# Patient Record
Sex: Male | Born: 1960
Health system: Southern US, Community
[De-identification: ages and names within clinical notes are randomized; demographics above are authoritative.]

## PROBLEM LIST (undated history)

## (undated) DIAGNOSIS — I10 Essential (primary) hypertension: Secondary | ICD-10-CM

## (undated) DIAGNOSIS — E119 Type 2 diabetes mellitus without complications: Secondary | ICD-10-CM

## (undated) HISTORY — DX: Type 2 diabetes mellitus without complications: E11.9

## (undated) HISTORY — DX: Essential (primary) hypertension: I10

---

## 2017-03-26 ENCOUNTER — Telehealth: Payer: Self-pay | Admitting: Physician Assistant

## 2017-03-26 NOTE — Telephone Encounter (Signed)
Called pt to remind them of their appt tomorrow. Advised them of the time, building number, early arrival and late policy. °

## 2017-03-27 ENCOUNTER — Ambulatory Visit: Payer: BLUE CROSS/BLUE SHIELD | Admitting: Physician Assistant

## 2017-03-27 ENCOUNTER — Other Ambulatory Visit: Payer: Self-pay

## 2017-03-27 ENCOUNTER — Encounter: Payer: Self-pay | Admitting: Physician Assistant

## 2017-03-27 VITALS — BP 159/93 | HR 70 | Temp 98.3°F | Resp 18 | Ht 71.65 in | Wt 160.4 lb

## 2017-03-27 DIAGNOSIS — I10 Essential (primary) hypertension: Secondary | ICD-10-CM | POA: Diagnosis not present

## 2017-03-27 DIAGNOSIS — R9431 Abnormal electrocardiogram [ECG] [EKG]: Secondary | ICD-10-CM | POA: Diagnosis not present

## 2017-03-27 DIAGNOSIS — Z23 Encounter for immunization: Secondary | ICD-10-CM | POA: Diagnosis not present

## 2017-03-27 DIAGNOSIS — E1165 Type 2 diabetes mellitus with hyperglycemia: Secondary | ICD-10-CM | POA: Diagnosis not present

## 2017-03-27 LAB — URINALYSIS, DIPSTICK ONLY
Bilirubin, UA: NEGATIVE
GLUCOSE, UA: NEGATIVE
Ketones, UA: NEGATIVE
Leukocytes, UA: NEGATIVE
Nitrite, UA: NEGATIVE
PH UA: 5.5 (ref 5.0–7.5)
Specific Gravity, UA: 1.022 (ref 1.005–1.030)
UUROB: 0.2 mg/dL (ref 0.2–1.0)

## 2017-03-27 LAB — POCT GLYCOSYLATED HEMOGLOBIN (HGB A1C): Hemoglobin A1C: 8.6

## 2017-03-27 MED ORDER — ATORVASTATIN CALCIUM 40 MG PO TABS: 40.0000 mg | ORAL_TABLET | Freq: Every day | ORAL | 0 refills | Status: DC

## 2017-03-27 MED ORDER — AMLODIPINE BESYLATE 5 MG PO TABS: 5.0000 mg | ORAL_TABLET | Freq: Every day | ORAL | 0 refills | Status: DC

## 2017-03-27 MED ORDER — METFORMIN HCL 1000 MG PO TABS: 1000.0000 mg | ORAL_TABLET | Freq: Every day | ORAL | 0 refills | Status: DC

## 2017-03-27 NOTE — Progress Notes (Signed)
03/27/2017 9:22 AM   DOB: 03-07-1960 / MRN: [id number]  SUBJECTIVE:  Tim Stuart is a 57 y.o. male former smoker presenting to establish care.  He is a hypertensive as well as a diabetic.  He has emigrated within the last 6 months from [place].  He has some medications with him here today however the text is in [place].  He speaks fairly good [place].  He denies a history of MI, stroke.  He is willing to go see a cardiologist given his abnormal EKG today.  He has No Known Allergies.   He  has a past medical history of Diabetes mellitus without complication ([place]) and Hypertension.    He  reports that he quit smoking about 3 years ago. His smoking use included cigarettes. He quit after 10.00 years of use. he has never used smokeless tobacco. He reports that he does not drink alcohol or use drugs. He  has no sexual activity history on file. The patient  has no past surgical history on file.  His family history includes Diabetes in his mother.  Review of Systems  Constitutional: Negative for chills, diaphoresis and fever.  Eyes: Negative.   Respiratory: Negative for cough, hemoptysis, sputum production, shortness of breath and wheezing.   Cardiovascular: Negative for chest pain, orthopnea and leg swelling.  Gastrointestinal: Negative for abdominal pain, blood in stool, constipation, diarrhea, heartburn, melena, nausea and vomiting.  Genitourinary: Negative for dysuria, flank pain, frequency, hematuria and urgency.  Skin: Negative for rash.  Neurological: Negative for dizziness, sensory change, speech change, focal weakness and headaches.    The problem list and medications were reviewed and updated by myself where necessary and exist elsewhere in the encounter.   OBJECTIVE:  BP (!) 159/93 (BP Location: Right Arm, Patient Position: Sitting, Cuff Size: Normal)   Pulse 70   Temp 98.3 F (36.8 C) (Oral)   Resp 18   Ht 5' 11.65" (1.82 m)   Wt 160 lb 6.4 oz (72.8 kg)   SpO2 99%    BMI 21.96 kg/m   Physical Exam  Constitutional: He appears well-developed. He is active and cooperative.  Non-toxic appearance.  HENT:  Right Ear: Hearing, tympanic membrane, external ear and ear canal normal.  Left Ear: Hearing, tympanic membrane, external ear and ear canal normal.  Nose: Nose normal. Right sinus exhibits no maxillary sinus tenderness and no frontal sinus tenderness. Left sinus exhibits no maxillary sinus tenderness and no frontal sinus tenderness.  Mouth/Throat: Uvula is midline, oropharynx is clear and moist and mucous membranes are normal. No oropharyngeal exudate, posterior oropharyngeal edema or tonsillar abscesses.  Eyes: Conjunctivae are normal. Pupils are equal, round, and reactive to light.  Cardiovascular: Normal rate, regular rhythm, S1 normal, S2 normal, normal heart sounds, intact distal pulses and normal pulses. Exam reveals no gallop and no friction rub.  No murmur heard. Pulmonary/Chest: Effort normal. No stridor. No tachypnea. No respiratory distress. He has no wheezes. He has no rales.  Abdominal: He exhibits no distension.  Musculoskeletal: He exhibits no edema.  Lymphadenopathy:       Head (right side): No submandibular and no tonsillar adenopathy present.       Head (left side): No submandibular and no tonsillar adenopathy present.    He has no cervical adenopathy.  Neurological: He is alert.  Skin: Skin is warm and dry. He is not diaphoretic. No pallor.  Vitals reviewed.     Results for orders placed or performed in visit on 03/27/17 (from the  past 72 hour(s))  POCT glycosylated hemoglobin (Hb A1C)     Status: Abnormal   Collection Time: 03/27/17  9:18 AM  Result Value Ref Range   Hemoglobin A1C 8.6     No results found.  ASSESSMENT AND PLAN:  [name] was seen today for establish care and diabetes.  Diagnoses and all orders for this visit:  Uncontrolled type 2 diabetes mellitus with hyperglycemia ([place]) -     POCT glycosylated  hemoglobin (Hb A1C) -     CBC with Differential -     CMP14+EGFR -     Urinalysis, dipstick only -     EKG 12-Lead -     Ambulatory referral to Ophthalmology -     metFORMIN (GLUCOPHAGE) 1000 MG tablet; Take 1 tablet (1,000 mg total) by mouth daily with breakfast.  Uncontrolled hypertension -     amLODipine (NORVASC) 5 MG tablet; Take 1 tablet (5 mg total) by mouth daily.  Needs flu shot -     Flu Vaccine QUAD 36+ mos IM  Need for prophylactic vaccination against Streptococcus pneumoniae (pneumococcus) -     Pneumococcal polysaccharide vaccine 23-valent greater than or equal to 2yo subcutaneous/IM  Nonspecific abnormal electrocardiogram (ECG) (EKG) -     Ambulatory referral to Cardiology  Other orders -     atorvastatin (LIPITOR) 40 MG tablet; Take 1 tablet (40 mg total) by mouth daily.    The patient is advised to call or return to clinic if he does not see an improvement in symptoms, or to seek the care of the closest emergency department if he worsens with the above plan.   Philis Fendt, MHS, PA-C [facility] 03/27/2017 9:22 AM

## 2017-03-27 NOTE — Patient Instructions (Addendum)
I am starting you on 3 medications.  One is a cholesterol medicine, the other is a diabetic medication and the other is an antihypertensive medication.  We will likely change these medications at your next visit in 10 days after your labs have resulted.    IF you received an x-ray today, you will receive an [facility]. Please contact [facility] at [phone] with questions or concerns regarding your invoice.   IF you received labwork today, you will receive an invoice from [facility]. Please contact [facility] at [phone] with questions or concerns regarding your invoice.   Our billing staff will not be able to assist you with questions regarding bills from these companies.  You will be contacted with the lab results as soon as they are available. The fastest way to get your results is to activate your My Chart account. Instructions are located on the last page of this paperwork. If you have not heard from us regarding the results in 2 weeks, please contact this office.

## 2017-03-28 LAB — CBC WITH DIFFERENTIAL/PLATELET
BASOS: 0 %
Basophils Absolute: 0 10*3/uL (ref 0.0–0.2)
EOS (ABSOLUTE): 0.4 10*3/uL (ref 0.0–0.4)
Eos: 4 %
HEMATOCRIT: 48.7 % (ref 37.5–51.0)
HEMOGLOBIN: 16.9 g/dL (ref 13.0–17.7)
IMMATURE GRANS (ABS): 0 10*3/uL (ref 0.0–0.1)
IMMATURE GRANULOCYTES: 0 %
LYMPHS: 34 %
Lymphocytes Absolute: 2.8 10*3/uL (ref 0.7–3.1)
MCH: 29.9 pg (ref 26.6–33.0)
MCHC: 34.7 g/dL (ref 31.5–35.7)
MCV: 86 fL (ref 79–97)
MONOS ABS: 0.6 10*3/uL (ref 0.1–0.9)
Monocytes: 8 %
NEUTROS PCT: 54 %
Neutrophils Absolute: 4.4 10*3/uL (ref 1.4–7.0)
Platelets: 231 10*3/uL (ref 150–379)
RBC: 5.65 x10E6/uL (ref 4.14–5.80)
RDW: 14.1 % (ref 12.3–15.4)
WBC: 8.2 10*3/uL (ref 3.4–10.8)

## 2017-03-28 LAB — CMP14+EGFR
A/G RATIO: 1.5 (ref 1.2–2.2)
ALT: 18 IU/L (ref 0–44)
AST: 18 IU/L (ref 0–40)
Albumin: 4.4 g/dL (ref 3.5–5.5)
Alkaline Phosphatase: 98 IU/L (ref 39–117)
BUN/Creatinine Ratio: 12 (ref 9–20)
BUN: 9 mg/dL (ref 6–24)
Bilirubin Total: 0.6 mg/dL (ref 0.0–1.2)
CALCIUM: 9.8 mg/dL (ref 8.7–10.2)
CO2: 24 mmol/L (ref 20–29)
CREATININE: 0.78 mg/dL (ref 0.76–1.27)
Chloride: 98 mmol/L (ref 96–106)
GFR calc Af Amer: 117 mL/min/{1.73_m2} (ref >59)
GFR, EST NON AFRICAN AMERICAN: 101 mL/min/{1.73_m2} (ref >59)
GLOBULIN, TOTAL: 2.9 g/dL (ref 1.5–4.5)
Glucose: 173 mg/dL — ABNORMAL HIGH (ref 65–99)
POTASSIUM: 4.3 mmol/L (ref 3.5–5.2)
SODIUM: 139 mmol/L (ref 134–144)
TOTAL PROTEIN: 7.3 g/dL (ref 6.0–8.5)

## 2017-03-28 NOTE — Progress Notes (Signed)
FYI. No need to call.  Patient coming back in ten days for lab review. Tim BostonMichael Kiarah Eckstein, MS, PA-C 4:46 PM, 03/28/2017

## 2017-04-05 ENCOUNTER — Ambulatory Visit: Payer: BLUE CROSS/BLUE SHIELD | Admitting: Physician Assistant

## 2017-04-05 DIAGNOSIS — I10 Essential (primary) hypertension: Secondary | ICD-10-CM

## 2017-04-05 DIAGNOSIS — E1165 Type 2 diabetes mellitus with hyperglycemia: Secondary | ICD-10-CM | POA: Diagnosis not present

## 2017-04-05 MED ORDER — AMLODIPINE BESYLATE 5 MG PO TABS: 5.0000 mg | ORAL_TABLET | Freq: Every day | ORAL | 3 refills | Status: DC

## 2017-04-05 MED ORDER — METFORMIN HCL 1000 MG PO TABS: 1000.0000 mg | ORAL_TABLET | Freq: Two times a day (BID) | ORAL | 3 refills | Status: DC

## 2017-04-05 MED ORDER — ATORVASTATIN CALCIUM 40 MG PO TABS: 40.0000 mg | ORAL_TABLET | Freq: Every day | ORAL | 3 refills | Status: DC

## 2017-04-05 NOTE — Progress Notes (Signed)
04/05/2017 8:45 AM   DOB: Dec 01, 1960 / MRN: [id number]  SUBJECTIVE:  Tim Stuart is a 57 y.o. male presenting for lab review and blood pressure check.  Patient has been compliant with 40 of atorvastatin, 1000 metformin daily along with 5 Norvasc.  He feels well denies any complaints.  He has yet to hear about his cardiology and ophthalmology referrals.  He has No Known Allergies.   He  has a past medical history of Diabetes mellitus without complication ([facility]) and Hypertension.    He  reports that he quit smoking about 3 years ago. His smoking use included cigarettes. He quit after 10.00 years of use. He has never used smokeless tobacco. He reports that he does not drink alcohol or use drugs. He  has no sexual activity history on file. The patient  has no past surgical history on file.  His family history includes Diabetes in his mother.  Review of Systems  Constitutional: Negative for chills, diaphoresis and fever.  Eyes: Negative.   Respiratory: Negative for cough, hemoptysis, sputum production, shortness of breath and wheezing.   Cardiovascular: Negative for chest pain, orthopnea and leg swelling.  Gastrointestinal: Negative for abdominal pain, blood in stool, constipation, diarrhea, heartburn, melena, nausea and vomiting.  Genitourinary: Negative for flank pain.  Skin: Negative for rash.  Neurological: Negative for dizziness, sensory change, speech change, focal weakness and headaches.    The problem list and medications were reviewed and updated by myself where necessary and exist elsewhere in the encounter.   OBJECTIVE:  BP 132/74   Pulse 65   Temp 97.9 F (36.6 C) (Oral)   Resp 16   Ht 5\' 11"  (1.803 m)   Wt 160 lb (72.6 kg)   SpO2 98%   BMI 22.32 kg/m   Wt Readings from Last 3 Encounters:  04/05/17 160 lb (72.6 kg)  03/27/17 160 lb 6.4 oz (72.8 kg)   Temp Readings from Last 3 Encounters:  04/05/17 97.9 F (36.6 C) (Oral)  03/27/17 98.3 F (36.8 C) (Oral)     BP Readings from Last 3 Encounters:  04/05/17 132/74  03/27/17 (!) 159/93   Pulse Readings from Last 3 Encounters:  04/05/17 65  03/27/17 70     Lab Results  Component Value Date   WBC 8.2 03/27/2017   HGB 16.9 03/27/2017   HCT 48.7 03/27/2017   MCV 86 03/27/2017   PLT 231 03/27/2017    Lab Results  Component Value Date   CREATININE 0.78 03/27/2017   BUN 9 03/27/2017   NA 139 03/27/2017   K 4.3 03/27/2017   CL 98 03/27/2017   CO2 24 03/27/2017    Lab Results  Component Value Date   ALT 18 03/27/2017   AST 18 03/27/2017   ALKPHOS 98 03/27/2017   BILITOT 0.6 03/27/2017   Lab Results  Component Value Date   HGBA1C 8.6 03/27/2017    No results found for: CHOL, HDL, LDLCALC, LDLDIRECT, TRIG, CHOLHDL   Physical Exam  Constitutional: He appears well-developed. He is active and cooperative.  Non-toxic appearance.  Cardiovascular: Normal rate.  Pulmonary/Chest: Effort normal. No tachypnea.  Neurological: He is alert.  Skin: Skin is warm and dry. He is not diaphoretic. No pallor.  Vitals reviewed.   No results found for this or any previous visit (from the past 72 hour(s)).  No results found.  ASSESSMENT AND PLAN:  Tim Stuart was seen today for medication management.  Diagnoses and all orders for this visit:  Uncontrolled  type 2 diabetes mellitus with hyperglycemia ([facility]) -     metFORMIN (GLUCOPHAGE) 1000 MG tablet; Take 1 tablet (1,000 mg total) by mouth 2 (two) times daily with a meal. -     atorvastatin (LIPITOR) 40 MG tablet; Take 1 tablet (40 mg total) by mouth daily.  Well-controlled hypertension -     amLODipine (NORVASC) 5 MG tablet; Take 1 tablet (5 mg total) by mouth daily.    The patient is advised to call or return to clinic if he does not see an improvement in symptoms, or to seek the care of the closest emergency department if he worsens with the above plan.   Deliah Boston, MHS, PA-C [facility] at [facility] 04/05/2017 8:45 AM

## 2017-04-05 NOTE — Patient Instructions (Signed)
     IF you received an x-ray today, you will receive an invoice from [facility]. Please contact [facility] at [phone] with questions or concerns regarding your invoice.   IF you received labwork today, you will receive an invoice from [facility]. Please contact [facility] at [phone] with questions or concerns regarding your invoice.   Our billing staff will not be able to assist you with questions regarding bills from these companies.  You will be contacted with the lab results as soon as they are available. The fastest way to get your results is to activate your My Chart account. Instructions are located on the last page of this paperwork. If you have not heard from us regarding the results in 2 weeks, please contact this office.     

## 2017-04-19 LAB — HM DIABETES EYE EXAM

## 2017-04-25 ENCOUNTER — Encounter: Payer: Self-pay | Admitting: *Deleted

## 2017-05-16 ENCOUNTER — Encounter: Payer: Self-pay | Admitting: *Deleted

## 2017-07-08 ENCOUNTER — Encounter: Payer: Self-pay | Admitting: Physician Assistant

## 2017-07-08 ENCOUNTER — Ambulatory Visit: Payer: BLUE CROSS/BLUE SHIELD | Admitting: Physician Assistant

## 2017-07-08 ENCOUNTER — Other Ambulatory Visit: Payer: Self-pay

## 2017-07-08 VITALS — BP 135/76 | HR 85 | Temp 98.0°F | Resp 16 | Ht 71.0 in | Wt 163.2 lb

## 2017-07-08 DIAGNOSIS — L600 Ingrowing nail: Secondary | ICD-10-CM | POA: Diagnosis not present

## 2017-07-08 DIAGNOSIS — Z23 Encounter for immunization: Secondary | ICD-10-CM | POA: Diagnosis not present

## 2017-07-08 DIAGNOSIS — Z114 Encounter for screening for human immunodeficiency virus [HIV]: Secondary | ICD-10-CM | POA: Diagnosis not present

## 2017-07-08 DIAGNOSIS — Z1211 Encounter for screening for malignant neoplasm of colon: Secondary | ICD-10-CM

## 2017-07-08 DIAGNOSIS — E1165 Type 2 diabetes mellitus with hyperglycemia: Secondary | ICD-10-CM

## 2017-07-08 DIAGNOSIS — Z1159 Encounter for screening for other viral diseases: Secondary | ICD-10-CM

## 2017-07-08 LAB — POCT URINALYSIS DIP (MANUAL ENTRY)
BILIRUBIN UA: NEGATIVE
BILIRUBIN UA: NEGATIVE mg/dL
LEUKOCYTES UA: NEGATIVE
NITRITE UA: NEGATIVE
PH UA: 6 (ref 5.0–8.0)
Protein Ur, POC: 30 mg/dL — AB
Spec Grav, UA: 1.02 (ref 1.010–1.025)
Urobilinogen, UA: 0.2 E.U./dL

## 2017-07-08 LAB — POCT GLYCOSYLATED HEMOGLOBIN (HGB A1C): HEMOGLOBIN A1C: 10.4 % — AB (ref 4.0–5.6)

## 2017-07-08 MED ORDER — ZOSTER VAC RECOMB ADJUVANTED 50 MCG/0.5ML IM SUSR: 0.5000 mL | Freq: Once | INTRAMUSCULAR | 1 refills | Status: AC

## 2017-07-08 MED ORDER — ATORVASTATIN CALCIUM 40 MG PO TABS: 40.0000 mg | ORAL_TABLET | Freq: Every day | ORAL | 3 refills | Status: DC

## 2017-07-08 MED ORDER — METFORMIN HCL ER 500 MG PO TB24: ORAL_TABLET | ORAL | 1 refills | Status: DC

## 2017-07-08 NOTE — Patient Instructions (Addendum)
Diabetes Mellitus and Nutrition When you have diabetes (diabetes mellitus), it is very important to have healthy eating habits because your blood sugar (glucose) levels are greatly affected by what you eat and drink. Eating healthy foods in the appropriate amounts, at about the same times every day, can help you:  Control your blood glucose.  Lower your risk of heart disease.  Improve your blood pressure.  Reach or maintain a healthy weight.  Every person with diabetes is different, and each person has different needs for a meal plan. Your health care provider may recommend that you work with a diet and nutrition specialist (dietitian) to make a meal plan that is best for you. Your meal plan may vary depending on factors such as:  The calories you need.  The medicines you take.  Your weight.  Your blood glucose, blood pressure, and cholesterol levels.  Your activity level.  Other health conditions you have, such as heart or kidney disease.  How do carbohydrates affect me? Carbohydrates affect your blood glucose level more than any other type of food. Eating carbohydrates naturally increases the amount of glucose in your blood. Carbohydrate counting is a method for keeping track of how many carbohydrates you eat. Counting carbohydrates is important to keep your blood glucose at a healthy level, especially if you use insulin or take certain oral diabetes medicines. It is important to know how many carbohydrates you can safely have in each meal. This is different for every person. Your dietitian can help you calculate how many carbohydrates you should have at each meal and for snack. Foods that contain carbohydrates include:  Bread, cereal, rice, pasta, and crackers.  Potatoes and corn.  Peas, beans, and lentils.  Milk and yogurt.  Fruit and juice.  Desserts, such as cakes, cookies, ice cream, and candy.  How does alcohol affect me? Alcohol can cause a sudden decrease in blood  glucose (hypoglycemia), especially if you use insulin or take certain oral diabetes medicines. Hypoglycemia can be a life-threatening condition. Symptoms of hypoglycemia (sleepiness, dizziness, and confusion) are similar to symptoms of having too much alcohol. If your health care provider says that alcohol is safe for you, follow these guidelines:  Limit alcohol intake to no more than 1 drink per day for nonpregnant women and 2 drinks per day for men. One drink equals 12 oz of beer, 5 oz of wine, or 1 oz of hard liquor.  Do not drink on an empty stomach.  Keep yourself hydrated with water, diet soda, or unsweetened iced tea.  Keep in mind that regular soda, juice, and other mixers may contain a lot of sugar and must be counted as carbohydrates.  What are tips for following this plan? Reading food labels  Start by checking the serving size on the label. The amount of calories, carbohydrates, fats, and other nutrients listed on the label are based on one serving of the food. Many foods contain more than one serving per package.  Check the total grams (g) of carbohydrates in one serving. You can calculate the number of servings of carbohydrates in one serving by dividing the total carbohydrates by 15. For example, if a food has 30 g of total carbohydrates, it would be equal to 2 servings of carbohydrates.  Check the number of grams (g) of saturated and trans fats in one serving. Choose foods that have low or no amount of these fats.  Check the number of milligrams (mg) of sodium in one serving. Most people   should limit total sodium intake to less than 2,300 mg per day.  Always check the nutrition information of foods labeled as "low-fat" or "nonfat". These foods may be higher in added sugar or refined carbohydrates and should be avoided.  Talk to your dietitian to identify your daily goals for nutrients listed on the label. Shopping  Avoid buying canned, premade, or processed foods. These  foods tend to be high in fat, sodium, and added sugar.  Shop around the outside edge of the grocery store. This includes fresh fruits and vegetables, bulk grains, fresh meats, and fresh dairy. Cooking  Use low-heat cooking methods, such as baking, instead of high-heat cooking methods like deep frying.  Cook using healthy oils, such as olive, canola, or sunflower oil.  Avoid cooking with butter, cream, or high-fat meats. Meal planning  Eat meals and snacks regularly, preferably at the same times every day. Avoid going long periods of time without eating.  Eat foods high in fiber, such as fresh fruits, vegetables, beans, and whole grains. Talk to your dietitian about how many servings of carbohydrates you can eat at each meal.  Eat 4-6 ounces of lean protein each day, such as lean meat, chicken, fish, eggs, or tofu. 1 ounce is equal to 1 ounce of meat, chicken, or fish, 1 egg, or 1/4 cup of tofu.  Eat some foods each day that contain healthy fats, such as avocado, nuts, seeds, and fish. Lifestyle   Check your blood glucose regularly.  Exercise at least 30 minutes 5 or more days each week, or as told by your health care provider.  Take medicines as told by your health care provider.  Do not use any products that contain nicotine or tobacco, such as cigarettes and e-cigarettes. If you need help quitting, ask your health care provider.  Work with a counselor or diabetes educator to identify strategies to manage stress and any emotional and social challenges. What are some questions to ask my health care provider?  Do I need to meet with a diabetes educator?  Do I need to meet with a dietitian?  What number can I call if I have questions?  When are the best times to check my blood glucose? Where to find more information:  American Diabetes Association: diabetes.org/food-and-fitness/food  Academy of Nutrition and Dietetics:  www.eatright.org/resources/health/diseases-and-conditions/diabetes  National Institute of Diabetes and Digestive and Kidney Diseases (NIH): www.niddk.nih.gov/health-information/diabetes/overview/diet-eating-physical-activity Summary  A healthy meal plan will help you control your blood glucose and maintain a healthy lifestyle.  Working with a diet and nutrition specialist (dietitian) can help you make a meal plan that is best for you.  Keep in mind that carbohydrates and alcohol have immediate effects on your blood glucose levels. It is important to count carbohydrates and to use alcohol carefully. This information is not intended to replace advice given to you by your health care provider. Make sure you discuss any questions you have with your health care provider. Document Released: 09/28/2004 Document Revised: 02/06/2016 Document Reviewed: 02/06/2016 Elsevier Interactive Patient Education  2018 Elsevier Inc.     IF you received an x-ray today, you will receive an invoice from [facility]. Please contact [facility] at [phone] with questions or concerns regarding your invoice.   IF you received labwork today, you will receive an invoice from [facility]. Please contact [facility] at [phone] with questions or concerns regarding your invoice.   Our billing staff will not be able to assist you with questions regarding bills from these companies.    You will be contacted with the lab results as soon as they are available. The fastest way to get your results is to activate your My Chart account. Instructions are located on the last page of this paperwork. If you have not heard from us regarding the results in 2 weeks, please contact this office.      

## 2017-07-08 NOTE — Progress Notes (Signed)
07/08/2017 8:53 AM   DOB: 12-13-1960 / MRN: [id number]  SUBJECTIVE:  Tim Stuart is a 57 y.o. male presenting for recheck uncontrolled diabetes. He is an immigrant.  He is also seen by cards given abnormal ekg.  He was found to have a normal stress test and echocardiogram.  He is now taking losartan for improved BP control.  He feels well today.  He is compliant with his medications. Former 2 pack a day smoker.    Has been off of metformin since early May due to fasting.   Has been  having some right medial toenail pain about the great right toe.  Denies fever chills nausea.  Pain worse with ambulation.  Has not tried anything for this yet.  This is a new problem.  Immunization History  Administered Date(s) Administered  . Influenza,inj,Quad PF,6+ Mos 03/27/2017  . Pneumococcal Polysaccharide-23 03/27/2017      Current Outpatient Medications:  .  amLODipine (NORVASC) 5 MG tablet, Take 1 tablet (5 mg total) by mouth daily., Disp: 90 tablet, Rfl: 3 .  atorvastatin (LIPITOR) 40 MG tablet, Take 1 tablet (40 mg total) by mouth daily., Disp: 90 tablet, Rfl: 3 .  metFORMIN (GLUCOPHAGE XR) 500 MG 24 hr tablet, Take one tab in the morning and night for a week.  Then increase to two tabs morning and night., Disp: 60 tablet, Rfl: 1 .  Zoster Vaccine Adjuvanted [facility]) injection, Inject 0.5 mLs into the muscle once for 1 dose. Due for booster in 6 months., Disp: 1 each, Rfl: 1   He has No Known Allergies.   He  has a past medical history of Diabetes mellitus without complication (HCC) and Hypertension.    He  reports that he quit smoking about 3 years ago. His smoking use included cigarettes. He has a 25.00 pack-year smoking history. He has never used smokeless tobacco. He reports that he does not drink alcohol or use drugs. He  has no sexual activity history on file. The patient  has no past surgical history on file.  His family history includes Diabetes in his mother.  Review of  Systems  Constitutional: Negative for chills, diaphoresis and fever.  Eyes: Negative.   Respiratory: Negative for cough, hemoptysis, sputum production, shortness of breath and wheezing.   Cardiovascular: Negative for chest pain, orthopnea and leg swelling.  Gastrointestinal: Negative for abdominal pain, blood in stool, constipation, diarrhea, heartburn, melena, nausea and vomiting.  Genitourinary: Negative for dysuria, flank pain, frequency, hematuria and urgency.  Skin: Negative for rash.  Neurological: Negative for dizziness, sensory change, speech change, focal weakness and headaches.    The problem list and medications were reviewed and updated by myself where necessary and exist elsewhere in the encounter.   OBJECTIVE:  BP 135/76   Pulse 85   Temp 98 F (36.7 C) (Oral)   Resp 16   Ht 5\' 11"  (1.803 m)   Wt 163 lb 3.2 oz (74 kg)   SpO2 97%   BMI 22.76 kg/m   Wt Readings from Last 3 Encounters:  07/08/17 163 lb 3.2 oz (74 kg)  04/05/17 160 lb (72.6 kg)  03/27/17 160 lb 6.4 oz (72.8 kg)   Temp Readings from Last 3 Encounters:  07/08/17 98 F (36.7 C) (Oral)  04/05/17 97.9 F (36.6 C) (Oral)  03/27/17 98.3 F (36.8 C) (Oral)   BP Readings from Last 3 Encounters:  07/08/17 135/76  04/05/17 132/74  03/27/17 (!) 159/93   Pulse Readings from Last  3 Encounters:  07/08/17 85  04/05/17 65  03/27/17 70    Physical Exam  Constitutional: He is oriented to person, place, and time. He appears well-developed. He does not appear ill.  Eyes: Pupils are equal, round, and reactive to light. Conjunctivae and EOM are normal.  Cardiovascular: Normal rate, regular rhythm, S1 normal, S2 normal, normal heart sounds, intact distal pulses and normal pulses. Exam reveals no gallop and no friction rub.  No murmur heard. Pulmonary/Chest: Effort normal. No stridor. No respiratory distress. He has no wheezes. He has no rales.  Abdominal: He exhibits no distension.  Musculoskeletal: Normal  range of motion. He exhibits no edema.       Feet:  Neurological: He is alert and oriented to person, place, and time. No cranial nerve deficit. Coordination normal.  Skin: Skin is warm and dry. He is not diaphoretic.  Psychiatric: He has a normal mood and affect.  Nursing note and vitals reviewed.   Lab Results  Component Value Date   HGBA1C 10.4 (A) 07/08/2017    Lab Results  Component Value Date   WBC 8.2 03/27/2017   HGB 16.9 03/27/2017   HCT 48.7 03/27/2017   MCV 86 03/27/2017   PLT 231 03/27/2017    Lab Results  Component Value Date   CREATININE 0.78 03/27/2017   BUN 9 03/27/2017   NA 139 03/27/2017   K 4.3 03/27/2017   CL 98 03/27/2017   CO2 24 03/27/2017    Lab Results  Component Value Date   ALT 18 03/27/2017   AST 18 03/27/2017   ALKPHOS 98 03/27/2017   BILITOT 0.6 03/27/2017     ASSESSMENT AND PLAN:  [name] was seen today for diabetes and blood pressure.  Diagnoses and all orders for this visit:  Uncontrolled type 2 diabetes mellitus with hyperglycemia [facility](HCC): Patient took himself off of metformin.  Has been having some gastric symptoms associated with short acting.  He also started fasting for Ramadan and tells me he has not had any metformin in about a month.  Of asked him not to skip medicines due to fasting as he can still take medication.  I am moving him to a long-acting metformin.  Given the medication noncompliance and uncontrolled nature of his diabetes and bring him back in a month to ensure that #1 he is taking his medicines.  Once I know he is taking the medications as prescribed I will plan to add on an SGLT2 inhibitor. -     Comprehensive metabolic panel -     Lipid panel -     POCT glycosylated hemoglobin (Hb A1C) -     POCT urinalysis dipstick -     Microalbumin, urine -     HM Diabetes Foot Exam -     atorvastatin (LIPITOR) 40 MG tablet; Take 1 tablet (40 mg total) by mouth daily. -     metFORMIN (GLUCOPHAGE XR) 500 MG 24 hr tablet;  Take one tab in the morning and night for a week.  Then increase to two tabs morning and night.  Screening for HIV (human immunodeficiency virus) -     HIV antibody  Need for hepatitis C screening test -     Hepatitis C antibody  Special screening for malignant neoplasms, colon -     Ambulatory referral to Gastroenterology  Need for shingles vaccine -     Zoster Vaccine Adjuvanted [facility](SHINGRIX) injection; Inject 0.5 mLs into the muscle once for 1 dose. Due for booster in  6 months.  Ingrown toenail of right foot -     Ambulatory referral to Podiatry    The patient is advised to call or return to clinic if he does not see an improvement in symptoms, or to seek the care of the closest emergency department if he worsens with the above plan.   Deliah Boston, MHS, PA-C Primary Care at [facility] 07/08/2017 8:53 AM

## 2017-07-09 LAB — COMPREHENSIVE METABOLIC PANEL
A/G RATIO: 1.7 (ref 1.2–2.2)
ALK PHOS: 102 IU/L (ref 39–117)
ALT: 24 IU/L (ref 0–44)
AST: 21 IU/L (ref 0–40)
Albumin: 4.5 g/dL (ref 3.5–5.5)
BILIRUBIN TOTAL: 0.3 mg/dL (ref 0.0–1.2)
BUN/Creatinine Ratio: 14 (ref 9–20)
BUN: 11 mg/dL (ref 6–24)
CO2: 21 mmol/L (ref 20–29)
Calcium: 9.5 mg/dL (ref 8.7–10.2)
Chloride: 101 mmol/L (ref 96–106)
Creatinine, Ser: 0.76 mg/dL (ref 0.76–1.27)
GFR calc Af Amer: 118 mL/min/{1.73_m2} (ref >59)
GFR calc non Af Amer: 102 mL/min/{1.73_m2} (ref >59)
Globulin, Total: 2.7 g/dL (ref 1.5–4.5)
Glucose: 240 mg/dL — ABNORMAL HIGH (ref 65–99)
POTASSIUM: 4.2 mmol/L (ref 3.5–5.2)
SODIUM: 140 mmol/L (ref 134–144)
Total Protein: 7.2 g/dL (ref 6.0–8.5)

## 2017-07-09 LAB — LIPID PANEL
Chol/HDL Ratio: 6.1 ratio — ABNORMAL HIGH (ref 0.0–5.0)
Cholesterol, Total: 244 mg/dL — ABNORMAL HIGH (ref 100–199)
HDL: 40 mg/dL (ref >39)
TRIGLYCERIDES: 463 mg/dL — AB (ref 0–149)

## 2017-07-09 LAB — HIV ANTIBODY (ROUTINE TESTING W REFLEX): HIV SCREEN 4TH GENERATION: NONREACTIVE

## 2017-07-09 LAB — MICROALBUMIN, URINE: MICROALBUM., U, RANDOM: 108 ug/mL

## 2017-07-09 LAB — HEPATITIS C ANTIBODY

## 2017-07-11 ENCOUNTER — Telehealth: Payer: Self-pay | Admitting: Physician Assistant

## 2017-07-11 NOTE — Telephone Encounter (Signed)
Called pt to try and reschedule. He kept saying that he has an appt already scheduled and that he couldn't come in any earlier. I think there was a definite language barrier. I will try and call back after lunch with translator.

## 2017-07-11 NOTE — Telephone Encounter (Signed)
Tried to call pt again, using translator services, but due to no DPR, I could not leave a message. If pt calls back, please try and get an appt scheduled ASAP with Chestine Sporelark for review of lab results. You may have to use translator services.  Thanks!

## 2017-07-11 NOTE — Telephone Encounter (Signed)
-----   Message from Tanja Portaitlin P Cockman sent at 07/10/2017  2:30 PM EDT -----   ----- Message ----- From: Ofilia Neaslark, Michael L, PA-C Sent: 07/10/2017   2:04 PM To: Pollyann KennedyShannon R Hinson, Pcp Clerical Pool  I need to see this patient back in the clinic as quickly as possible. Deliah BostonMichael Clark, MS, PA-C 2:04 PM, 07/10/2017

## 2017-08-02 ENCOUNTER — Telehealth: Payer: Self-pay | Admitting: *Deleted

## 2017-08-02 ENCOUNTER — Other Ambulatory Visit: Payer: Self-pay

## 2017-08-02 ENCOUNTER — Ambulatory Visit: Payer: BLUE CROSS/BLUE SHIELD | Admitting: Physician Assistant

## 2017-08-02 ENCOUNTER — Encounter: Payer: Self-pay | Admitting: Physician Assistant

## 2017-08-02 VITALS — BP 120/70 | HR 84 | Temp 98.8°F | Resp 16 | Ht 70.5 in | Wt 158.8 lb

## 2017-08-02 DIAGNOSIS — E785 Hyperlipidemia, unspecified: Secondary | ICD-10-CM

## 2017-08-02 DIAGNOSIS — Z1211 Encounter for screening for malignant neoplasm of colon: Secondary | ICD-10-CM | POA: Diagnosis not present

## 2017-08-02 DIAGNOSIS — E1165 Type 2 diabetes mellitus with hyperglycemia: Secondary | ICD-10-CM | POA: Diagnosis not present

## 2017-08-02 DIAGNOSIS — Z23 Encounter for immunization: Secondary | ICD-10-CM

## 2017-08-02 LAB — POCT GLYCOSYLATED HEMOGLOBIN (HGB A1C): Hemoglobin A1C: 10.2 % — AB (ref 4.0–5.6)

## 2017-08-02 LAB — POCT URINALYSIS DIP (MANUAL ENTRY)
Bilirubin, UA: NEGATIVE
Glucose, UA: 500 mg/dL — AB
Ketones, POC UA: NEGATIVE mg/dL
LEUKOCYTES UA: NEGATIVE
NITRITE UA: NEGATIVE
PH UA: 5.5 (ref 5.0–8.0)
Spec Grav, UA: 1.02 (ref 1.010–1.025)
UROBILINOGEN UA: 0.2 U/dL

## 2017-08-02 MED ORDER — GLIPIZIDE 5 MG PO TABS: 5.0000 mg | ORAL_TABLET | Freq: Two times a day (BID) | ORAL | 3 refills | Status: DC

## 2017-08-02 MED ORDER — ZOSTER VAC RECOMB ADJUVANTED 50 MCG/0.5ML IM SUSR: 0.5000 mL | Freq: Once | INTRAMUSCULAR | 1 refills | Status: AC

## 2017-08-02 NOTE — Progress Notes (Signed)
08/02/2017 9:03 AM   DOB: 29-Jan-1960 / MRN: [id number]  SUBJECTIVE:  Wlliam Stuart is a 57 y.o. male presenting for poorly controlled diabetes. Symptoms present for years. .  The problem is not getting better. He has tried metformin 500 bid despite my urging him to take 2 in the morning and 2 at night.    Immunization History  Administered Date(s) Administered  . Influenza,inj,Quad PF,6+ Mos 03/27/2017  . Pneumococcal Polysaccharide-23 03/27/2017   .  He has No Known Allergies.   He  has a past medical history of Diabetes mellitus without complication (HCC) and Hypertension.    He  reports that he quit smoking about 3 years ago. His smoking use included cigarettes. He has a 25.00 pack-year smoking history. He has never used smokeless tobacco. He reports that he does not drink alcohol or use drugs. He  has no sexual activity history on file. The patient  has no past surgical history on file.  His family history includes Diabetes in his mother.  Review of Systems  Constitutional: Negative for chills, diaphoresis and fever.  Gastrointestinal: Negative for nausea.  Skin: Negative for rash.  Neurological: Negative for dizziness.    The problem list and medications were reviewed and updated by myself where necessary and exist elsewhere in the encounter.   OBJECTIVE:  BP 120/70 (BP Location: Right Arm, Patient Position: Sitting, Cuff Size: Normal)   Pulse 84   Temp 98.8 F (37.1 C) (Oral)   Resp 16   Ht 5' 10.5" (1.791 m)   Wt 158 lb 12.8 oz (72 kg)   SpO2 97%   BMI 22.46 kg/m   Wt Readings from Last 3 Encounters:  08/02/17 158 lb 12.8 oz (72 kg)  07/08/17 163 lb 3.2 oz (74 kg)  04/05/17 160 lb (72.6 kg)   Temp Readings from Last 3 Encounters:  08/02/17 98.8 F (37.1 C) (Oral)  07/08/17 98 F (36.7 C) (Oral)  04/05/17 97.9 F (36.6 C) (Oral)   BP Readings from Last 3 Encounters:  08/02/17 120/70  07/08/17 135/76  04/05/17 132/74   Pulse Readings from Last 3  Encounters:  08/02/17 84  07/08/17 85  04/05/17 65    Physical Exam  Constitutional: He is oriented to person, place, and time. He appears well-developed. He does not appear ill.  Eyes: Pupils are equal, round, and reactive to light. Conjunctivae and EOM are normal.  Cardiovascular: Normal rate.  Pulmonary/Chest: Effort normal.  Abdominal: He exhibits no distension.  Musculoskeletal: Normal range of motion.  Neurological: He is alert and oriented to person, place, and time. No cranial nerve deficit. Coordination normal.  Skin: Skin is warm and dry. He is not diaphoretic.  Psychiatric: He has a normal mood and affect.  Nursing note and vitals reviewed.   Lab Results  Component Value Date   HGBA1C 10.2 (A) 08/02/2017    Lab Results  Component Value Date   WBC 8.2 03/27/2017   HGB 16.9 03/27/2017   HCT 48.7 03/27/2017   MCV 86 03/27/2017   PLT 231 03/27/2017    Lab Results  Component Value Date   CREATININE 0.76 07/08/2017   BUN 11 07/08/2017   NA 140 07/08/2017   K 4.2 07/08/2017   CL 101 07/08/2017   CO2 21 07/08/2017    Lab Results  Component Value Date   ALT 24 07/08/2017   AST 21 07/08/2017   ALKPHOS 102 07/08/2017   BILITOT 0.3 07/08/2017    No results found  for: TSH  Lab Results  Component Value Date   CHOL 244 (H) 07/08/2017   HDL 40 07/08/2017   LDLCALC Comment 07/08/2017   TRIG 463 (H) 07/08/2017   CHOLHDL 6.1 (H) 07/08/2017     ASSESSMENT AND PLAN:  Darwyn was seen today for diabetes.  Diagnoses and all orders for this visit:  Uncontrolled type 2 diabetes mellitus with hyperglycemia (HCC) -     POCT urinalysis dipstick -     POCT glycosylated hemoglobin (Hb A1C) -     glipiZIDE (GLUCOTROL) 5 MG tablet; Take 1 tablet (5 mg total) by mouth 2 (two) times daily before a meal.  Screening for colon cancer -     Cologuard  Need for shingles vaccine -     Zoster Vaccine Adjuvanted [facility](SHINGRIX) injection; Inject 0.5 mLs into the muscle once  for 1 dose. Due for booster in 6 months.  Dyslipidemia -     Lipid Panel    The patient is advised to call or return to clinic if he does not see an improvement in symptoms, or to seek the care of the closest emergency department if he worsens with the above plan.   Deliah BostonMichael Arie Powell, MHS, PA-C Primary Care at [facility] 08/02/2017 9:03 AM

## 2017-08-02 NOTE — Patient Instructions (Addendum)
  Come back in 2 months for a diabetes recheck with Dr. Leretha PolSantiago.    IF you received an x-ray today, you will receive an [facility] from [facility]. Please contact [facility] at [phone] with questions or concerns regarding your invoice.   IF you received labwork today, you will receive an invoice from [facility]. Please contact [facility] at [phone] with questions or concerns regarding your invoice.   Our billing staff will not be able to assist you with questions regarding bills from these companies.  You will be contacted with the lab results as soon as they are available. The fastest way to get your results is to activate your My Chart account. Instructions are located on the last page of this paperwork. If you have not heard from us regarding the results in 2 weeks, please contact this office.

## 2017-08-02 NOTE — Telephone Encounter (Signed)
Pt advised that he left the office before bloodwork. He will return for Lipid panel and cologard. Pt advised that he needs to be fasting for labs.

## 2017-08-12 ENCOUNTER — Encounter: Payer: Self-pay | Admitting: Physician Assistant

## 2017-08-23 ENCOUNTER — Telehealth: Payer: Self-pay | Admitting: Family Medicine

## 2017-08-23 NOTE — Telephone Encounter (Signed)
Called patient in regards to his appt he has with Dr. Leretha PolSantiago on 10/08/2017. The provider will not be in the office on that day and would need to be rescheduled. Pt did not have a DPR on file so did not leave a voicemail.

## 2017-10-08 ENCOUNTER — Ambulatory Visit: Payer: BLUE CROSS/BLUE SHIELD | Admitting: Family Medicine

## 2017-12-30 ENCOUNTER — Encounter (HOSPITAL_COMMUNITY): Payer: Self-pay

## 2017-12-30 ENCOUNTER — Ambulatory Visit (HOSPITAL_COMMUNITY)
Admission: EM | Admit: 2017-12-30 | Discharge: 2017-12-30 | Disposition: A | Discharge status: Home / Self Care | Payer: BLUE CROSS/BLUE SHIELD | Attending: Urgent Care | Admitting: Urgent Care

## 2017-12-30 DIAGNOSIS — R030 Elevated blood-pressure reading, without diagnosis of hypertension: Secondary | ICD-10-CM

## 2017-12-30 DIAGNOSIS — M79604 Pain in right leg: Secondary | ICD-10-CM

## 2017-12-30 DIAGNOSIS — M545 Low back pain, unspecified: Secondary | ICD-10-CM

## 2017-12-30 DIAGNOSIS — I10 Essential (primary) hypertension: Secondary | ICD-10-CM

## 2017-12-30 MED ORDER — CYCLOBENZAPRINE HCL 5 MG PO TABS: 5.0000 mg | ORAL_TABLET | Freq: Two times a day (BID) | ORAL | 1 refills | Status: DC | PRN

## 2017-12-30 MED ORDER — MELOXICAM 7.5 MG PO TABS: 7.5000 mg | ORAL_TABLET | Freq: Every day | ORAL | 1 refills | Status: DC

## 2017-12-30 NOTE — ED Provider Notes (Signed)
MRN: [id number] DOB: 05-16-1960  Subjective:   Tim Stuart is a 57 y.o. male presenting for 2 day history of acute intermittent aching low back pain, now having constant sharp right sided leg pain along lateral thigh. Has tried icing, ibuprofen with minimal relief. Patient works in Systems [employer], [employer]; stands for most of his shift. Works 5 days/week, 8 hours per day. Does not hydrate as well as he should. Denies past history of back injuries, surgeries. Has a history of HTN, diabetes. Patient BP checks are usually 140's systolic at home. Denies fever, n/v, abdominal pain, pelvic pain, dysuria, hematuria, falls, weakness, numbness or tingling, calf pain and swelling. Denies smoking cigarettes.   No current facility-administered medications for this encounter.   Current Outpatient Medications:  .  amLODipine (NORVASC) 5 MG tablet, Take 1 tablet (5 mg total) by mouth daily., Disp: 90 tablet, Rfl: 3 .  atorvastatin (LIPITOR) 40 MG tablet, Take 1 tablet (40 mg total) by mouth daily., Disp: 90 tablet, Rfl: 3 .  glipiZIDE (GLUCOTROL) 5 MG tablet, Take 1 tablet (5 mg total) by mouth 2 (two) times daily before a meal., Disp: 60 tablet, Rfl: 3 .  metFORMIN (GLUCOPHAGE XR) 500 MG 24 hr tablet, Take one tab in the morning and night for a week.  Then increase to two tabs morning and night., Disp: 60 tablet, Rfl: 1   No Known Allergies  Past Medical History:  Diagnosis Date  . Diabetes mellitus without complication (HCC)   . Hypertension     Denies past surgical history.   Objective:   Vitals: BP (!) 180/90 (BP Location: Right Arm)   Pulse 77   Temp 98 F (36.7 C) (Oral)   Resp 16   SpO2 99%   BP was 164/97, pulse 69 on recheck by PA-Saifullah Jolley.   BP Readings from Last 3 Encounters:  12/30/17 (!) 180/90  08/02/17 120/70  07/08/17 135/76   Physical Exam Constitutional:      General: He is not in acute distress.    Appearance: Normal appearance. He is well-developed and normal weight.  He is not ill-appearing, toxic-appearing or diaphoretic.  Eyes:     General: No scleral icterus.    Extraocular Movements: Extraocular movements intact.     Pupils: Pupils are equal, round, and reactive to light.  Cardiovascular:     Rate and Rhythm: Normal rate and regular rhythm.     Heart sounds: No murmur. No friction rub. No gallop.   Pulmonary:     Effort: Pulmonary effort is normal. No respiratory distress.     Breath sounds: No wheezing or rales.  Musculoskeletal:     Lumbar back: He exhibits decreased range of motion (Flexion and extension). He exhibits no tenderness, no bony tenderness, no swelling, no edema and no deformity.     Right upper leg: He exhibits no tenderness, no bony tenderness, no swelling, no edema, no deformity and no laceration.       Legs:     Comments: Negative straight leg raise bilaterally.  Neurological:     General: No focal deficit present.     Mental Status: He is alert and oriented to person, place, and time.     Cranial Nerves: No cranial nerve deficit.     Motor: No weakness.     Coordination: Coordination normal.     Gait: Gait normal.     Deep Tendon Reflexes: Reflexes normal.  Psychiatric:        Mood and Affect: Mood normal.  Behavior: Behavior normal.    Assessment and Plan :   Right leg pain  Acute right-sided low back pain without sciatica  Essential hypertension  Elevated blood pressure reading  I suspect patient's blood pressure is elevated today due to his right leg and low back pain.  Counseled on conservative management for this including use of meloxicam and Flexeril.  Recommended patient hydrate aggressively, provided with a work note for a couple of days.  His symptoms that is not consistent with sciatica; however, counseled patient on signs symptoms consistent with this as well as inflammatory type process, IT band syndrome.  Patient is to monitor his BP at home and recheck with his PCP if it remains elevated.  Counseled patient on potential for adverse effects with medications prescribed today, patient verbalized understanding. Return-to-clinic precautions discussed, patient verbalized understanding.     Wallis BambergMani, Kemo Spruce, PA-C 12/30/17 [id number]

## 2017-12-30 NOTE — ED Triage Notes (Signed)
Pt present right leg pain that started on Saturday.  Pt denies any injury to his right leg.

## 2018-01-01 ENCOUNTER — Other Ambulatory Visit: Payer: Self-pay

## 2018-01-01 ENCOUNTER — Ambulatory Visit (INDEPENDENT_AMBULATORY_CARE_PROVIDER_SITE_OTHER): Payer: BLUE CROSS/BLUE SHIELD

## 2018-01-01 ENCOUNTER — Encounter: Payer: Self-pay | Admitting: Family Medicine

## 2018-01-01 ENCOUNTER — Ambulatory Visit: Payer: BLUE CROSS/BLUE SHIELD | Admitting: Family Medicine

## 2018-01-01 VITALS — BP 140/82 | HR 98 | Temp 98.1°F | Resp 14 | Ht 72.0 in | Wt 160.0 lb

## 2018-01-01 DIAGNOSIS — E785 Hyperlipidemia, unspecified: Secondary | ICD-10-CM

## 2018-01-01 DIAGNOSIS — M79651 Pain in right thigh: Secondary | ICD-10-CM

## 2018-01-01 DIAGNOSIS — M545 Low back pain, unspecified: Secondary | ICD-10-CM

## 2018-01-01 DIAGNOSIS — I10 Essential (primary) hypertension: Secondary | ICD-10-CM

## 2018-01-01 DIAGNOSIS — E1165 Type 2 diabetes mellitus with hyperglycemia: Secondary | ICD-10-CM | POA: Diagnosis not present

## 2018-01-01 MED ORDER — GABAPENTIN 300 MG PO CAPS: 300.0000 mg | ORAL_CAPSULE | Freq: Three times a day (TID) | ORAL | 3 refills | Status: DC

## 2018-01-01 NOTE — Patient Instructions (Signed)
° ° ° °  If you have lab work done today you will be contacted with your lab results within the next 2 weeks.  If you have not heard from us then please contact us. The fastest way to get your results is to register for My Chart. ° ° °IF you received an x-ray today, you will receive an invoice from [facility]. Please contact [facility] at [phone] with questions or concerns regarding your invoice.  ° °IF you received labwork today, you will receive an invoice from [facility]. Please contact [facility] at [phone] with questions or concerns regarding your invoice.  ° °Our billing staff will not be able to assist you with questions regarding bills from these companies. ° °You will be contacted with the lab results as soon as they are available. The fastest way to get your results is to activate your My Chart account. Instructions are located on the last page of this paperwork. If you have not heard from us regarding the results in 2 weeks, please contact this office. °  ° ° ° °

## 2018-01-01 NOTE — Progress Notes (Signed)
12/18/20192:48 PM  Tim Stuart [birth year], 57 y.o. male [id number]  Chief Complaint  Patient presents with  . Leg Pain    back pain radiate down the right leg. Was seen at Urgent Care 12/30/2017 little better but not much,can not put pressure on leg for a long time and was told to follow up with pcp    HPI:   Patient is a 57 y.o. male with past medical history significant for DM2, HTN, HLP who presents today for followup after being seen at [facility] for Right sided back pain  Started with right back pain x 5 days then it resolved And then started about 2 days ago with right thigh pain Pain is constant, like fire Normally sleeps on his right side  Seen on 12/30/17 - 2 days ago Started on flexeril and meloxicam [employer], standing   Previous PCP Tim Spore, PA-C Last OV July 2019  Here with interpreter, Arabic  Checks BP at home, 130/80s  Lab Results  Component Value Date   HGBA1C 10.2 (A) 08/02/2017   HGBA1C 10.4 (A) 07/08/2017   HGBA1C 8.6 03/27/2017   Lab Results  Component Value Date   LDLCALC Comment 07/08/2017   CREATININE 0.76 07/08/2017    Fall Risk  01/01/2018 08/02/2017 04/05/2017 03/27/2017  Falls in the past year? 0 No No No     Depression screen [facility] 2/9 01/01/2018 08/02/2017 07/08/2017  Decreased Interest 0 0 0  Down, Depressed, Hopeless 0 0 0  PHQ - 2 Score 0 0 0    No Known Allergies  Prior to Admission medications   Medication Sig Start Date End Date Taking? Authorizing Provider  amLODipine (NORVASC) 5 MG tablet Take 1 tablet (5 mg total) by mouth daily. 04/05/17  Yes Tim Neas, PA-C  atorvastatin (LIPITOR) 40 MG tablet Take 1 tablet (40 mg total) by mouth daily. 07/08/17  Yes Tim Neas, PA-C  cyclobenzaprine (FLEXERIL) 5 MG tablet Take 1 tablet (5 mg total) by mouth 2 (two) times daily as needed for muscle spasms. 12/30/17  Yes Tim Bamberg, PA-C  glipiZIDE (GLUCOTROL) 5 MG tablet Take 1 tablet (5 mg total) by mouth 2 (two)  times daily before a meal. 08/02/17  Yes Tim Neas, PA-C  meloxicam (MOBIC) 7.5 MG tablet Take 1 tablet (7.5 mg total) by mouth daily. 12/30/17  Yes Tim Bamberg, PA-C  metFORMIN (GLUCOPHAGE XR) 500 MG 24 hr tablet Take one tab in the morning and night for a week.  Then increase to two tabs morning and night. 07/08/17  Yes Tim Stuart    Past Medical History:  Diagnosis Date  . Diabetes mellitus without complication (HCC)   . Hypertension     No past surgical history on file.  Social History   Tobacco Use  . Smoking status: Former Smoker    Packs/day: 2.50    Years: 10.00    Pack years: 25.00    Types: Cigarettes    Last attempt to quit: 03/28/2014    Years since quitting: 3.7  . Smokeless tobacco: Never Used  Substance Use Topics  . Alcohol use: No    Frequency: Never    Family History  Problem Relation Age of Onset  . Diabetes Mother     ROS   OBJECTIVE:  Blood pressure 140/82, pulse 98, temperature 98.1 F (36.7 C), temperature source Oral, resp. rate 14, height 6' (1.829 m), weight 160 lb (72.6 kg), SpO2 98 %. Body mass index is 21.7 kg/m.  Physical Exam Vitals signs and nursing note reviewed.  Constitutional:      Appearance: He is well-developed.  HENT:     Head: Normocephalic and atraumatic.  Eyes:     Conjunctiva/sclera: Conjunctivae normal.     Pupils: Pupils are equal, round, and reactive to light.  Neck:     Musculoskeletal: Neck supple.  Cardiovascular:     Rate and Rhythm: Normal rate and regular rhythm.     Heart sounds: No murmur. No friction rub. No gallop.   Pulmonary:     Effort: Pulmonary effort is normal.     Breath sounds: Normal breath sounds. No wheezing or rales.  Musculoskeletal:     Right hip: Normal.     Lumbar back: He exhibits tenderness (right lower back). He exhibits no bony tenderness, no swelling and no spasm.     Right upper leg: He exhibits tenderness (along mid/distal lateral thigh, worse with full  extension of knee, guarding). He exhibits no swelling.  Skin:    General: Skin is warm and dry.  Neurological:     Mental Status: He is alert and oriented to person, place, and time.     Sensory: Sensation is intact.     Motor: Motor function is intact.     Gait: Gait abnormal (favors right leg).     Deep Tendon Reflexes:     Reflex Scores:      Patellar reflexes are 1+ on the right side and 1+ on the left side.      Achilles reflexes are 1+ on the right side and 1+ on the left side.    Comments: Neg SLR      Dg Lumbar Spine Complete  Result Date: 01/01/2018 CLINICAL DATA:  Acute low back and RIGHT thigh pain EXAM: LUMBAR SPINE - COMPLETE 4+ VIEW COMPARISON:  None FINDINGS: Hypoplastic last RIGHT rib. Five additional non-rib-bearing lumbar type vertebra with partial sacralization of the RIGHT transverse process of L5. Bones appear mildly demineralized. Vertebral body heights maintained. Minimal disc space narrowing L4-L5. No fracture, subluxation, bone destruction or spondylolysis. SI joints preserved. Atherosclerotic calcifications of abdominal aorta and iliac arteries. IMPRESSION: Minimal degenerative disc disease changes at L4-L5. No acute abnormalities. Electronically Signed   By: Tim SouthwardMark  Stuart M.D.   On: 01/01/2018 15:55   Dg Femur, Min 2 Views Right  Result Date: 01/01/2018 CLINICAL DATA:  Acute low back and RIGHT thigh pain EXAM: RIGHT FEMUR 2 VIEWS COMPARISON:  None FINDINGS: Slight under penetration at the hip. Osseous mineralization normal. Joint spaces preserved. No acute fracture, dislocation, or bone destruction. Minimal atherosclerotic calcifications at the RIGHT superficial femoral artery. IMPRESSION: No acute osseous abnormalities. Electronically Signed   By: Tim SouthwardMark  Stuart M.D.   On: 01/01/2018 15:59     ASSESSMENT and PLAN  1. Acute right-sided low back pain, unspecified whether sciatica present Area of pain not c/w with DDD. Lateral cutaneous femoral nerve syndrome?  Starting gabapentin. R/se/b reviewed. RTC precautions given. Work excuse given for 3 days.  - DG Lumbar Spine Complete; Future  2. Acute pain of right thigh - DG FEMUR, MIN 2 VIEWS RIGHT; Future  3. Uncontrolled type 2 diabetes mellitus with hyperglycemia (HCC) - Hemoglobin A1c - Comprehensive metabolic panel - TSH  4. Dyslipidemia - Lipid panel  5. Essential hypertension, benign - CBC - Care order/instruction:  Checking labs for chronic medical conditions. BP acceptable given pain. Will re-eval at next visit  Other orders - gabapentin (NEURONTIN) 300 MG capsule; Take 1 capsule (300  mg total) by mouth 3 (three) times daily.  Return in about 2 weeks (around 01/15/2018) for establish with new PCP .    Myles Lipps, MD Primary Care at [facility] [address] [place], [place] [address] Ph.  [phone] Fax [phone]

## 2018-01-02 LAB — COMPREHENSIVE METABOLIC PANEL
ALT: 21 IU/L (ref 0–44)
AST: 21 IU/L (ref 0–40)
Albumin/Globulin Ratio: 1.5 (ref 1.2–2.2)
Albumin: 4.4 g/dL (ref 3.5–5.5)
Alkaline Phosphatase: 97 IU/L (ref 39–117)
BUN/Creatinine Ratio: 12 (ref 9–20)
BUN: 10 mg/dL (ref 6–24)
Bilirubin Total: 0.5 mg/dL (ref 0.0–1.2)
CO2: 23 mmol/L (ref 20–29)
Calcium: 9.6 mg/dL (ref 8.7–10.2)
Chloride: 94 mmol/L — ABNORMAL LOW (ref 96–106)
Creatinine, Ser: 0.81 mg/dL (ref 0.76–1.27)
GFR calc Af Amer: 114 mL/min/{1.73_m2} (ref >59)
GFR calc non Af Amer: 99 mL/min/{1.73_m2} (ref >59)
Globulin, Total: 3 g/dL (ref 1.5–4.5)
Glucose: 347 mg/dL — ABNORMAL HIGH (ref 65–99)
Potassium: 4.2 mmol/L (ref 3.5–5.2)
Sodium: 136 mmol/L (ref 134–144)
Total Protein: 7.4 g/dL (ref 6.0–8.5)

## 2018-01-02 LAB — LIPID PANEL
Chol/HDL Ratio: 6.2 ratio — ABNORMAL HIGH (ref 0.0–5.0)
Cholesterol, Total: 274 mg/dL — ABNORMAL HIGH (ref 100–199)
HDL: 44 mg/dL (ref >39)
Triglycerides: 522 mg/dL — ABNORMAL HIGH (ref 0–149)

## 2018-01-02 LAB — CBC
Hematocrit: 53 % — ABNORMAL HIGH (ref 37.5–51.0)
Hemoglobin: 17.9 g/dL — ABNORMAL HIGH (ref 13.0–17.7)
MCH: 27.9 pg (ref 26.6–33.0)
MCHC: 33.8 g/dL (ref 31.5–35.7)
MCV: 83 fL (ref 79–97)
Platelets: 254 10*3/uL (ref 150–450)
RBC: 6.41 x10E6/uL — ABNORMAL HIGH (ref 4.14–5.80)
RDW: 14.8 % (ref 12.3–15.4)
WBC: 8.2 10*3/uL (ref 3.4–10.8)

## 2018-01-02 LAB — TSH: TSH: 1.36 u[IU]/mL (ref 0.450–4.500)

## 2018-01-02 LAB — HEMOGLOBIN A1C
Est. average glucose Bld gHb Est-mCnc: 232 mg/dL
Hgb A1c MFr Bld: 9.7 % — ABNORMAL HIGH (ref 4.8–5.6)

## 2018-01-06 ENCOUNTER — Other Ambulatory Visit: Payer: Self-pay

## 2018-01-06 ENCOUNTER — Ambulatory Visit: Payer: BLUE CROSS/BLUE SHIELD | Admitting: Family Medicine

## 2018-01-06 ENCOUNTER — Encounter: Payer: Self-pay | Admitting: Family Medicine

## 2018-01-06 VITALS — BP 153/84 | HR 98 | Temp 98.8°F | Resp 18 | Ht 71.42 in | Wt 159.6 lb

## 2018-01-06 DIAGNOSIS — M79651 Pain in right thigh: Secondary | ICD-10-CM | POA: Diagnosis not present

## 2018-01-06 DIAGNOSIS — M5431 Sciatica, right side: Secondary | ICD-10-CM

## 2018-01-06 NOTE — Progress Notes (Signed)
Patient ID: Tim Stuart, male    DOB: March 30, 1960  Age: 57 y.o. MRN: [id number]  Chief Complaint  Patient presents with  . Leg Pain    X10 days- right leg  . Medication Refill    amlodipine    Subjective:   Patient continues to have severe pain in his right thigh.  He cannot get comfortable with it.  Hurts when he is laying in bed it hurts when he is up and around.  The medicines do not seem to have helped much.  He is taking gabapentin.  This came on spontaneously after a work shift with no known injury.  It is just persisted or gotten worse.  Current allergies, medications, problem list, past/family and social histories reviewed.  Objective:  BP (!) 153/84   Pulse 98   Temp 98.8 F (37.1 C) (Oral)   Resp 18   Ht 5' 11.42" (1.814 m)   Wt 159 lb 9.6 oz (72.4 kg)   SpO2 98%   BMI 22.00 kg/m   Good range of motion of spine.  There is a little added pain on extension of his spine.  There is no tenderness in the groin region.  Good range of motion of the hip.  However I cannot do straight leg raising without intense pain, he is unable to fully extend his right leg keeping the knee flexed a small amount.  The muscles seem tight and a little tender to touch on the anterolateral portion of the thigh.  Assessment & Plan:   Assessment: 1. Right thigh pain   2. Sciatica of right side       Plan: This is an atypical muscular pain versus an atypical sciatic pain into the just the thigh.  Orders Placed This Encounter  Procedures  . MR Lumbar Spine Wo Contrast    Standing Status:   Future    Standing Expiration Date:   03/10/2019    Order Specific Question:   ** REASON FOR EXAM (FREE TEXT)    Answer:   Not responding to medical means.  Cannot put on steroids due to diabetes.    Order Specific Question:   What is the patient's sedation requirement?    Answer:   No Sedation    Order Specific Question:   Does the patient have a pacemaker or implanted devices?    Answer:   No   Order Specific Question:   Preferred imaging location?    Answer:   External    Order Specific Question:   Radiology Contrast Protocol - do NOT remove file path    Answer:   [email or web address]  . Ambulatory referral to Sports Medicine    Referral Priority:   Routine    Referral Type:   Consultation    Referred to Provider:   Otho DarnerMcKinley, Dominic W, MD    [facility]    Number of Visits Requested:   1    No orders of the defined types were placed in this encounter.        Patient Instructions    This is not the typical sciatic pain, so I am both trying to schedule you for an MRI (which insurance may deny since you do not meet all the criteria) and making a referral to sports medicine that can assess to determine whether or not the pain is coming from the muscle.  Continue your current medications  Try some Zostrix cream.  Keep your appointment with Leretha PolSantiago to follow-up on  the diabetes and pain.   If you have lab work done today you will be contacted with your lab results within the next 2 weeks.  If you have not heard from us then please contact us. The fastest way to get your results is to register for My Chart.   IF you received an x-ray today, you will receive an invoice from [facility]. Please contact [facility] at [phone] with questions or concerns regarding your invoice.   IF you received labwork today, you will receive an invoice from [facility]. Please contact [facility] at [phone] with questions or concerns regarding your invoice.   Our billing staff will not be able to assist you with questions regarding bills from these companies.  You will be contacted with the lab results as soon as they are available. The fastest way to get your results is to activate your My Chart account. Instructions are located on the last page of this paperwork. If you have not heard from us regarding the results in  2 weeks, please contact this office.        No follow-ups on file.   Janace Hoardavid Kelin Nixon, MD 01/06/2018

## 2018-01-06 NOTE — Patient Instructions (Addendum)
  This is not the typical sciatic pain, so I am both trying to schedule you for an MRI (which insurance may deny since you do not meet all the criteria) and making a referral to sports medicine that can assess to determine whether or not the pain is coming from the muscle.  Continue your current medications  Try some Zostrix cream.  Keep your appointment with Leretha PolSantiago to follow-up on the diabetes and pain.   If you have lab work done today you will be contacted with your lab results within the next 2 weeks.  If you have not heard from us then please contact us. The fastest way to get your results is to register for My Chart.   IF you received an x-ray today, you will receive an invoice from [facility]. Please contact [facility] at [phone] with questions or concerns regarding your invoice.   IF you received labwork today, you will receive an invoice from [facility]. Please contact [facility] at [phone] with questions or concerns regarding your invoice.   Our billing staff will not be able to assist you with questions regarding bills from these companies.  You will be contacted with the lab results as soon as they are available. The fastest way to get your results is to activate your My Chart account. Instructions are located on the last page of this paperwork. If you have not heard from us regarding the results in 2 weeks, please contact this office.

## 2018-01-16 ENCOUNTER — Encounter: Payer: Self-pay | Admitting: Family Medicine

## 2018-01-16 ENCOUNTER — Ambulatory Visit: Payer: BLUE CROSS/BLUE SHIELD | Admitting: Family Medicine

## 2018-01-16 ENCOUNTER — Other Ambulatory Visit: Payer: Self-pay

## 2018-01-16 VITALS — BP 162/88 | HR 99 | Temp 98.0°F | Resp 18 | Ht 71.42 in | Wt 157.6 lb

## 2018-01-16 DIAGNOSIS — I10 Essential (primary) hypertension: Secondary | ICD-10-CM

## 2018-01-16 DIAGNOSIS — E1165 Type 2 diabetes mellitus with hyperglycemia: Secondary | ICD-10-CM | POA: Diagnosis not present

## 2018-01-16 DIAGNOSIS — E785 Hyperlipidemia, unspecified: Secondary | ICD-10-CM | POA: Diagnosis not present

## 2018-01-16 LAB — GLUCOSE, POCT (MANUAL RESULT ENTRY): POC Glucose: 326 mg/dl — AB (ref 70–99)

## 2018-01-16 LAB — POC MICROSCOPIC URINALYSIS (UMFC): Mucus: ABSENT

## 2018-01-16 LAB — POCT URINALYSIS DIP (MANUAL ENTRY)
Bilirubin, UA: NEGATIVE
Glucose, UA: 500 mg/dL — AB
Leukocytes, UA: NEGATIVE
Nitrite, UA: NEGATIVE
Protein Ur, POC: 100 mg/dL — AB
Spec Grav, UA: 1.015 (ref 1.010–1.025)
Urobilinogen, UA: 1 E.U./dL
pH, UA: 5 (ref 5.0–8.0)

## 2018-01-16 MED ORDER — METFORMIN HCL ER 500 MG PO TB24: 500.0000 mg | ORAL_TABLET | Freq: Two times a day (BID) | ORAL | 3 refills | Status: DC

## 2018-01-16 MED ORDER — LISINOPRIL 20 MG PO TABS: 20.0000 mg | ORAL_TABLET | Freq: Every day | ORAL | 3 refills | Status: DC

## 2018-01-16 MED ORDER — GLIPIZIDE 10 MG PO TABS: 10.0000 mg | ORAL_TABLET | Freq: Two times a day (BID) | ORAL | 3 refills | Status: DC

## 2018-01-16 MED ORDER — BLOOD GLUCOSE METER KIT: PACK | 5 refills | Status: DC

## 2018-01-16 MED ORDER — INSULIN GLARGINE 100 UNIT/ML SOLOSTAR PEN: 30.0000 [IU] | PEN_INJECTOR | Freq: Every day | SUBCUTANEOUS | 2 refills | Status: DC

## 2018-01-16 MED ORDER — PEN NEEDLES 32G X 6 MM MISC: 1.0000 | Freq: Every day | 2 refills | Status: DC

## 2018-01-16 NOTE — Progress Notes (Signed)
1/2/202011:53 AM  Tim Stuart Mar 29, 1960, 58 y.o. male [id number]  Chief Complaint  Patient presents with  . Establish Care    right thigh pain follow up     HPI:   Patient is a 58 y.o. male with past medical history significant for DM2, HTN, HLP who presents today for followup  Last seen by Dr Linna Darner - ordered MRI and sent to sports medicine for pain of right thigh, patient was given exercises to do at home, has fu in 2 weeks  Regarding DM Last DM meds he took maybe 2 months ago He thinks metformin dose is to high, when he tried to take TDD 2022m started having GI sx His mother and sister also have DM  Here with interpreter  Lab Results  Component Value Date   HGBA1C 9.7 (H) 01/01/2018   HGBA1C 10.2 (A) 08/02/2017   HGBA1C 10.4 (A) 07/08/2017   Lab Results  Component Value Date   LAjoComment 01/01/2018   CREATININE 0.81 01/01/2018   Lab Results  Component Value Date   CHOL 274 (H) 01/01/2018   HDL 44 01/01/2018   LCherry TreeComment 01/01/2018   TRIG 522 (H) 01/01/2018   CHOLHDL 6.2 (H) 01/01/2018    Fall Risk  01/16/2018 01/06/2018 01/01/2018 08/02/2017 04/05/2017  Falls in the past year? 0 0 0 No No     [facility] 01/16/2018 01/06/2018 01/01/2018  Decreased Interest 0 0 0  Down, Depressed, Hopeless 0 0 0  PHQ - 2 Score 0 0 0    No Known Allergies  Prior to Admission medications   Medication Sig Start Date End Date Taking? Authorizing Provider  amLODipine (NORVASC) 5 MG tablet Take 1 tablet (5 mg total) by mouth daily. 04/05/17  Yes CTereasa Coop PA-C  atorvastatin (LIPITOR) 40 MG tablet Take 1 tablet (40 mg total) by mouth daily. 07/08/17  Yes CTereasa Coop PA-C  cyclobenzaprine (FLEXERIL) 5 MG tablet Take 1 tablet (5 mg total) by mouth 2 (two) times daily as needed for muscle spasms. 12/30/17  Yes MJaynee Eagles PA-C  gabapentin (NEURONTIN) 300 MG capsule Take 1 capsule (300 mg total) by mouth 3 (three) times daily. 01/01/18  Yes  SRutherford Guys MD  glipiZIDE (GLUCOTROL) 5 MG tablet Take 1 tablet (5 mg total) by mouth 2 (two) times daily before a meal. 08/02/17  Yes CTereasa Coop PA-C  meloxicam (MOBIC) 7.5 MG tablet Take 1 tablet (7.5 mg total) by mouth daily. 12/30/17  Yes MJaynee Eagles PA-C  metFORMIN (GLUCOPHAGE XR) 500 MG 24 hr tablet Take one tab in the morning and night for a week.  Then increase to two tabs morning and night. 07/08/17  Yes CHillis Range   Past Medical History:  Diagnosis Date  . Diabetes mellitus without complication ([facility]   . Hypertension     No past surgical history on file.  Social History   Tobacco Use  . Smoking status: Former Smoker    Packs/day: 2.50    Years: 10.00    Pack years: 25.00    Types: Cigarettes    Last attempt to quit: 03/28/2014    Years since quitting: 3.8  . Smokeless tobacco: Never Used  Substance Use Topics  . Alcohol use: No    Frequency: Never    Family History  Problem Relation Age of Onset  . Diabetes Mother     Review of Systems  Constitutional: Negative for chills and fever.  Respiratory: Negative  for cough and shortness of breath.   Cardiovascular: Negative for chest pain, palpitations and leg swelling.  Gastrointestinal: Negative for abdominal pain, nausea and vomiting.    OBJECTIVE:  Blood pressure (!) 166/95, pulse 99, temperature 98 F (36.7 C), temperature source Oral, resp. rate 18, height 5' 11.42" (1.814 m), weight 157 lb 9.6 oz (71.5 kg), SpO2 95 %. Body mass index is 21.72 kg/m.   BP Readings from Last 3 Encounters:  01/16/18 (!) 162/88  01/06/18 (!) 153/84  01/01/18 140/82    Wt Readings from Last 3 Encounters:  01/16/18 157 lb 9.6 oz (71.5 kg)  01/06/18 159 lb 9.6 oz (72.4 kg)  01/01/18 160 lb (72.6 kg)    Physical Exam Vitals signs and nursing note reviewed.  Constitutional:      Appearance: He is well-developed.  HENT:     Head: Normocephalic and atraumatic.  Eyes:     Conjunctiva/sclera:  Conjunctivae normal.     Pupils: Pupils are equal, round, and reactive to light.  Neck:     Musculoskeletal: Neck supple.  Cardiovascular:     Rate and Rhythm: Normal rate and regular rhythm.     Heart sounds: No murmur. No friction rub. No gallop.   Pulmonary:     Effort: Pulmonary effort is normal.     Breath sounds: Normal breath sounds. No wheezing or rales.  Skin:    General: Skin is warm and dry.  Neurological:     Mental Status: He is alert and oriented to person, place, and time.     Results for orders placed or performed in visit on 01/16/18 (from the past 24 hour(s))  POCT urinalysis dipstick     Status: Abnormal   Collection Time: 01/16/18 12:28 PM  Result Value Ref Range   Color, UA yellow yellow   Clarity, UA clear clear   Glucose, UA =500 (A) negative mg/dL   Bilirubin, UA negative negative   Ketones, POC UA trace (5) (A) negative mg/dL   Spec Grav, UA 1.015 1.010 - 1.025   Blood, UA moderate (A) negative   pH, UA 5.0 5.0 - 8.0   Protein Ur, POC =100 (A) negative mg/dL   Urobilinogen, UA 1.0 0.2 or 1.0 E.U./dL   Nitrite, UA Negative Negative   Leukocytes, UA Negative Negative  POCT glucose (manual entry)     Status: Abnormal   Collection Time: 01/16/18 12:28 PM  Result Value Ref Range   POC Glucose 326 (A) 70 - 99 mg/dl  POCT Microscopic Urinalysis ([facility])     Status: Abnormal   Collection Time: 01/16/18  1:50 PM  Result Value Ref Range   WBC,UR,HPF,POC None None WBC/hpf   RBC,UR,HPF,POC Moderate (A) None RBC/hpf   Bacteria None None, Too numerous to count   Mucus Absent Absent   Epithelial Cells, UR Per Microscopy Few (A) None, Too numerous to count cells/hpf    ASSESSMENT and PLAN 1. Uncontrolled type 2 diabetes mellitus with hyperglycemia (HCC) Adding lantus - discussed titration, increasing glipizide to max dose. Decreasing metformin to previously tolerated dose. Labs to r/o autoimmune component. Discussed checking cbgs at home. New meds r/se/b  -  POCT urinalysis dipstick - C-peptide - POCT glucose (manual entry) - Care order/instruction: - Glutamic acid decarboxylase auto abs - Anti-islet cell antibody - POCT Microscopic Urinalysis ([facility]) - glipiZIDE (GLUCOTROL) 10 MG tablet; Take 1 tablet (10 mg total) by mouth 2 (two) times daily before a meal. - metFORMIN (GLUCOPHAGE XR) 500 MG 24 hr tablet; Take  1 tablet (500 mg total) by mouth 2 (two) times daily with a meal.  2. Essential hypertension, benign Uncontrolled. Adding lisinopril given + proteinuria.   3. Dyslipidemia Elevated TG in setting of hyperglycemia. Discussed importance of working on glucose control. Cont atorvastatin  Other orders - Insulin Glargine (LANTUS SOLOSTAR) 100 UNIT/ML Solostar Pen; Inject 30 Units into the skin at bedtime. - Insulin Pen Needle (PEN NEEDLES) 32G X 6 MM MISC; 1 each by Does not apply route daily. - blood glucose meter kit and supplies; Per insurance preference. Use up to three times daily as directed. Dx E11.9, z79.4 - lisinopril (PRINIVIL,ZESTRIL) 20 MG tablet; Take 1 tablet (20 mg total) by mouth daily.  Return in about 4 weeks (around 02/13/2018).    Rutherford Guys, MD Primary Care at [place], [place] [address] Ph.  [phone] Fax [phone]

## 2018-01-16 NOTE — Patient Instructions (Addendum)
  Start lantus 10 units at bedtime. Increase by 2 units every 3 days. Fasting sugar should be between 90-130. Stop increasing lantus once fasting glucose is at goal. I also want you to stop increasing if you start having symptoms of low sugar (shaking, sweats, anxious, confused) in the the middle of night. Continue with metformin and glucotrol, notice changed doses  Start lisinopril 1/2 tablet once a day. Check BP every day. Goal is less than 130/80. If after a week, BP not at goal, then increase lisinopril to 1 tablet daily Continue with amlodipine 5mg  daily  If you have lab work done today you will be contacted with your lab results within the next 2 weeks.  If you have not heard from us then please contact us. The fastest way to get your results is to register for My Chart.   IF you received an x-ray today, you will receive an invoice from [facility]. Please contact [facility] at [phone] with questions or concerns regarding your invoice.   IF you received labwork today, you will receive an invoice from [facility]. Please contact [facility] at [phone] with questions or concerns regarding your invoice.   Our billing staff will not be able to assist you with questions regarding bills from these companies.  You will be contacted with the lab results as soon as they are available. The fastest way to get your results is to activate your My Chart account. Instructions are located on the last page of this paperwork. If you have not heard from us regarding the results in 2 weeks, please contact this office.

## 2018-01-20 LAB — C-PEPTIDE: C-Peptide: 3.9 ng/mL (ref 1.1–4.4)

## 2018-01-20 LAB — GLUTAMIC ACID DECARBOXYLASE AUTO ABS: Glutamic Acid Decarb Ab: <5 U/mL (ref 0.0–5.0)

## 2018-01-20 LAB — ANTI-ISLET CELL ANTIBODY: Islet Cell Ab: NEGATIVE

## 2018-02-26 ENCOUNTER — Other Ambulatory Visit: Payer: Self-pay

## 2018-02-26 ENCOUNTER — Encounter: Payer: Self-pay | Admitting: Family Medicine

## 2018-02-26 ENCOUNTER — Ambulatory Visit: Payer: BLUE CROSS/BLUE SHIELD | Admitting: Family Medicine

## 2018-02-26 VITALS — BP 142/80 | HR 100 | Temp 97.6°F | Ht 71.42 in | Wt 163.0 lb

## 2018-02-26 DIAGNOSIS — I10 Essential (primary) hypertension: Secondary | ICD-10-CM

## 2018-02-26 DIAGNOSIS — Z1211 Encounter for screening for malignant neoplasm of colon: Secondary | ICD-10-CM

## 2018-02-26 DIAGNOSIS — R319 Hematuria, unspecified: Secondary | ICD-10-CM

## 2018-02-26 DIAGNOSIS — D751 Secondary polycythemia: Secondary | ICD-10-CM | POA: Diagnosis not present

## 2018-02-26 DIAGNOSIS — E1165 Type 2 diabetes mellitus with hyperglycemia: Secondary | ICD-10-CM

## 2018-02-26 LAB — POCT URINALYSIS DIP (MANUAL ENTRY)
Glucose, UA: NEGATIVE mg/dL
Ketones, POC UA: NEGATIVE mg/dL
Leukocytes, UA: NEGATIVE
Nitrite, UA: NEGATIVE
Protein Ur, POC: 100 mg/dL — AB
Spec Grav, UA: >=1.03 — AB (ref 1.010–1.025)
Urobilinogen, UA: 1 E.U./dL
pH, UA: 5 (ref 5.0–8.0)

## 2018-02-26 LAB — POC MICROSCOPIC URINALYSIS (UMFC): Mucus: ABSENT

## 2018-02-26 LAB — BASIC METABOLIC PANEL
BUN/Creatinine Ratio: 13 (ref 9–20)
BUN: 11 mg/dL (ref 6–24)
CO2: 22 mmol/L (ref 20–29)
Calcium: 9 mg/dL (ref 8.7–10.2)
Chloride: 102 mmol/L (ref 96–106)
Creatinine, Ser: 0.84 mg/dL (ref 0.76–1.27)
GFR calc Af Amer: 112 mL/min/{1.73_m2} (ref >59)
GFR calc non Af Amer: 97 mL/min/{1.73_m2} (ref >59)
Glucose: 166 mg/dL — ABNORMAL HIGH (ref 65–99)
Potassium: 4.3 mmol/L (ref 3.5–5.2)
Sodium: 138 mmol/L (ref 134–144)

## 2018-02-26 LAB — POCT CBC
Granulocyte percent: 60.9 %G (ref 37–80)
HCT, POC: 45 % — AB (ref 29–41)
Hemoglobin: 15.3 g/dL — AB (ref 11–14.6)
Lymph, poc: 2.7 (ref 0.6–3.4)
MCH, POC: 28.9 pg (ref 27–31.2)
MCHC: 34.1 g/dL (ref 31.8–35.4)
MCV: 84.9 fL (ref 76–111)
MID (cbc): 0.2 (ref 0–0.9)
MPV: 7.4 fL (ref 0–99.8)
POC Granulocyte: 4.5 (ref 2–6.9)
POC LYMPH PERCENT: 35.9 %L (ref 10–50)
POC MID %: 3.2 %M (ref 0–12)
Platelet Count, POC: 254 10*3/uL (ref 142–424)
RBC: 5.3 M/uL (ref 4.69–6.13)
RDW, POC: 14.3 %
WBC: 7.4 10*3/uL (ref 4.6–10.2)

## 2018-02-26 MED ORDER — INSULIN GLARGINE 100 UNIT/ML SOLOSTAR PEN: 15.0000 [IU] | PEN_INJECTOR | Freq: Every day | SUBCUTANEOUS | 2 refills | Status: DC

## 2018-02-26 NOTE — Patient Instructions (Addendum)
Blood glucose goal for when you check in the morning before eating anything is between 90 - 130, if not reached, please increase lantus to 15 units.   Increase lisinopril to 1 tablet (20mg  a day) for improved blood pressure control  There continues to be blood present in your urine. I am referring you to urology for further evaluation and treatment as needed

## 2018-02-26 NOTE — Progress Notes (Signed)
2/12/20209:24 AM  Tim Stuart 07-05-1960, 58 y.o. male [id number]  Chief Complaint  Patient presents with  . Diabetes  . Hypertension    does not take the medication everyday, says he does alot of driving and forgets  . Medication Refill    metformin, supplies for meter    HPI:   Patient is a 58 y.o. male with past medical history significant for DM2, HTN, HLP who presents today for followup  Last OV Jan 2020 Started on lantus and increased glipizide for DM Started lisinopril for BP and + urine microalb/crt  Has been checking sugars fasting only This morning it was 137, highest 155 about 4-5 days ago Feels it is slowly coming down Has been doing lantus 10 units at bedtime Denies any sx hypoglycemia He stopped taking glipizide - "' I don't feel good" He is taking metformin XR 572m bid  Started BP medication, tolerating well, took this morning Amlodipine 567mand lisinopril 1092mhecks BP at home, 130-140/70s Yesterday did not sleep well  Right thigh pain has resolved  He reports is doing much better  I personally reconciled medications with patient  He does not smoke, denies any puritus aw showering  Declines interpreter   Lab Results  Component Value Date   HGBA1C 9.7 (H) 01/01/2018   HGBA1C 10.2 (A) 08/02/2017   HGBA1C 10.4 (A) 07/08/2017   Lab Results  Component Value Date   LDLChilomment 01/01/2018   CREATININE 0.81 01/01/2018    Fall Risk  02/26/2018 01/16/2018 01/06/2018 01/01/2018 08/02/2017  Falls in the past year? 0 0 0 0 No  Number falls in past yr: 0 - - - -  Injury with Fall? 0 - - - -     [facility] 02/26/2018 01/16/2018 01/06/2018  Decreased Interest 0 0 0  Down, Depressed, Hopeless 0 0 0  PHQ - 2 Score 0 0 0    No Known Allergies  Prior to Admission medications   Medication Sig Start Date End Date Taking? Authorizing Provider  amLODipine (NORVASC) 5 MG tablet Take 1 tablet (5 mg total) by mouth daily. 04/05/17   Yes ClaTereasa CoopA-C  atorvastatin (LIPITOR) 40 MG tablet Take 1 tablet (40 mg total) by mouth daily. 07/08/17  Yes ClaTereasa CoopA-C  blood glucose meter kit and supplies Per insurance preference. Use up to three times daily as directed. Dx E11.9, z79.4 01/16/18  Yes SanRutherford GuysD  cyclobenzaprine (FLEXERIL) 5 MG tablet Take 1 tablet (5 mg total) by mouth 2 (two) times daily as needed for muscle spasms. 12/30/17  Yes ManJaynee EaglesA-C  gabapentin (NEURONTIN) 300 MG capsule Take 1 capsule (300 mg total) by mouth 3 (three) times daily. 01/01/18  Yes SanRutherford GuysD  glipiZIDE (GLUCOTROL) 10 MG tablet Take 1 tablet (10 mg total) by mouth 2 (two) times daily before a meal. 01/16/18  Yes SanRutherford GuysD  Insulin Glargine (LANTUS SOLOSTAR) 100 UNIT/ML Solostar Pen Inject 30 Units into the skin at bedtime. 01/16/18  Yes SanRutherford GuysD  Insulin Pen Needle (PEN NEEDLES) 32G X 6 MM MISC 1 each by Does not apply route daily. 01/16/18  Yes SanRutherford GuysD  lisinopril (PRINIVIL,ZESTRIL) 20 MG tablet Take 1 tablet (20 mg total) by mouth daily. 01/16/18  Yes SanRutherford GuysD  meloxicam (MOBIC) 7.5 MG tablet Take 1 tablet (7.5 mg total) by mouth daily. 12/30/17  Yes ManJaynee EaglesA-C  metFORMIN (GLUCOPHAGE  XR) 500 MG 24 hr tablet Take 1 tablet (500 mg total) by mouth 2 (two) times daily with a meal. 01/16/18  Yes Rutherford Guys, MD    Past Medical History:  Diagnosis Date  . Diabetes mellitus without complication ([place])   . Hypertension     History reviewed. No pertinent surgical history.  Social History   Tobacco Use  . Smoking status: Former Smoker    Packs/day: 2.50    Years: 10.00    Pack years: 25.00    Types: Cigarettes    Last attempt to quit: 03/28/2014    Years since quitting: 3.9  . Smokeless tobacco: Never Used  Substance Use Topics  . Alcohol use: No    Frequency: Never    Family History  Problem Relation Age of Onset  . Diabetes Mother      ROS Per hpi  OBJECTIVE:  Blood pressure (!) 142/80, pulse 100, temperature 97.6 F (36.4 C), temperature source Oral, height 5' 11.42" (1.814 m), weight 163 lb (73.9 kg), SpO2 97 %. Body mass index is 22.47 kg/m.   Physical Exam Vitals signs and nursing note reviewed.  Constitutional:      Appearance: He is well-developed.  HENT:     Head: Normocephalic and atraumatic.  Eyes:     Conjunctiva/sclera: Conjunctivae normal.     Pupils: Pupils are equal, round, and reactive to light.  Neck:     Musculoskeletal: Neck supple.  Pulmonary:     Effort: Pulmonary effort is normal.  Skin:    General: Skin is warm and dry.  Neurological:     Mental Status: He is alert and oriented to person, place, and time.     Results for orders placed or performed in visit on 02/26/18 (from the past 24 hour(s))  POCT urinalysis dipstick     Status: Abnormal   Collection Time: 02/26/18  9:48 AM  Result Value Ref Range   Color, UA yellow yellow   Clarity, UA clear clear   Glucose, UA negative negative mg/dL   Bilirubin, UA small (A) negative   Ketones, POC UA negative negative mg/dL   Spec Grav, UA >=1.030 (A) 1.010 - 1.025   Blood, UA moderate (A) negative   pH, UA 5.0 5.0 - 8.0   Protein Ur, POC =100 (A) negative mg/dL   Urobilinogen, UA 1.0 0.2 or 1.0 E.U./dL   Nitrite, UA Negative Negative   Leukocytes, UA Negative Negative  POCT CBC     Status: Abnormal   Collection Time: 02/26/18  9:55 AM  Result Value Ref Range   WBC 7.4 4.6 - 10.2 K/uL   Lymph, poc 2.7 0.6 - 3.4   POC LYMPH PERCENT 35.9 10 - 50 %L   MID (cbc) 0.2 0 - 0.9   POC MID % 3.2 0 - 12 %M   POC Granulocyte 4.5 2 - 6.9   Granulocyte percent 60.9 37 - 80 %G   RBC 5.30 4.69 - 6.13 M/uL   Hemoglobin 15.3 (A) 11 - 14.6 g/dL   HCT, POC 45.0 (A) 29 - 41 %   MCV 84.9 76 - 111 fL   MCH, POC 28.9 27 - 31.2 pg   MCHC 34.1 31.8 - 35.4 g/dL   RDW, POC 14.3 %   Platelet Count, POC 254 142 - 424 K/uL   MPV 7.4 0 - 99.8 fL   POCT Microscopic Urinalysis ([facility])     Status: Abnormal   Collection Time: 02/26/18  9:58 AM  Result  Value Ref Range   WBC,UR,HPF,POC None None WBC/hpf   RBC,UR,HPF,POC Few (A) None RBC/hpf   Bacteria None None, Too numerous to count   Mucus Absent Absent   Epithelial Cells, UR Per Microscopy None None, Too numerous to count cells/hpf      ASSESSMENT and PLAN  1. Uncontrolled type 2 diabetes mellitus with hyperglycemia (HCC) Improved cbgs. Cont with metformin and lantus. Discussed increasing lantus to 15 units at bedtime for fasting goal 90-130. Consider adding a 3rd agent as needed at next visit pending A1c.  2. Hematuria, unspecified type Persistent. No sign of infection or casts on UA. Referring to urology for further eval and treatment - POCT urinalysis dipstick - POCT Microscopic Urinalysis ([facility]) - Basic metabolic panel - Ambulatory referral to Urology  3. Essential hypertension, benign Improved but not at goal. Increase lisinopril 26m once a day. Cont amlodipine 525m - Care order/instruction:  4. Polycythemia Improved H/H, normal rbc. Concentrated urine. No ssx of vera. Push fluids. Cont to monitor - POCT CBC  5. Special screening for malignant neoplasms, colon - Cologuard   Return in about 2 months (around 04/27/2018) for DM and HTN.    IrRutherford GuysMD [facility] [place] [id number].  [phone]

## 2018-04-16 ENCOUNTER — Other Ambulatory Visit: Payer: Self-pay

## 2018-04-16 DIAGNOSIS — E1165 Type 2 diabetes mellitus with hyperglycemia: Secondary | ICD-10-CM

## 2018-04-23 ENCOUNTER — Other Ambulatory Visit: Payer: Self-pay

## 2018-04-23 ENCOUNTER — Telehealth (INDEPENDENT_AMBULATORY_CARE_PROVIDER_SITE_OTHER): Payer: BLUE CROSS/BLUE SHIELD | Admitting: Family Medicine

## 2018-04-23 ENCOUNTER — Telehealth: Payer: Self-pay | Admitting: Family Medicine

## 2018-04-23 DIAGNOSIS — E785 Hyperlipidemia, unspecified: Secondary | ICD-10-CM

## 2018-04-23 DIAGNOSIS — D751 Secondary polycythemia: Secondary | ICD-10-CM

## 2018-04-23 DIAGNOSIS — R319 Hematuria, unspecified: Secondary | ICD-10-CM | POA: Diagnosis not present

## 2018-04-23 DIAGNOSIS — E1165 Type 2 diabetes mellitus with hyperglycemia: Secondary | ICD-10-CM

## 2018-04-23 DIAGNOSIS — I10 Essential (primary) hypertension: Secondary | ICD-10-CM | POA: Diagnosis not present

## 2018-04-23 DIAGNOSIS — Z125 Encounter for screening for malignant neoplasm of prostate: Secondary | ICD-10-CM | POA: Diagnosis not present

## 2018-04-23 DIAGNOSIS — Z794 Long term (current) use of insulin: Secondary | ICD-10-CM

## 2018-04-23 MED ORDER — BLOOD GLUCOSE METER KIT: PACK | 5 refills | Status: AC

## 2018-04-23 MED ORDER — ATORVASTATIN CALCIUM 40 MG PO TABS: 40.0000 mg | ORAL_TABLET | Freq: Every day | ORAL | 3 refills | Status: DC

## 2018-04-23 MED ORDER — INSULIN GLARGINE 100 UNIT/ML SOLOSTAR PEN: 10.0000 [IU] | PEN_INJECTOR | Freq: Every day | SUBCUTANEOUS | 2 refills | Status: DC

## 2018-04-23 MED ORDER — AMLODIPINE BESYLATE 5 MG PO TABS: 5.0000 mg | ORAL_TABLET | Freq: Every day | ORAL | 3 refills | Status: DC

## 2018-04-23 NOTE — Progress Notes (Signed)
Virtual Visit via telephone Note  I connected with patient on 04/23/18 at 835 by telephone and verified that I am speaking with the correct person using two identifiers. Tim Stuart is currently located at home and arabic interpreter is currently with her during visit. The provider, Rutherford Guys, MD is located in their office at time of visit.  I discussed the limitations, risks, security and privacy concerns of performing an evaluation and management service by telephone and the availability of in person appointments. I also discussed with the patient that there may be a patient responsible charge related to this service. The patient expressed understanding and agreed to proceed.    Telephone visit today for routine OV  HPI  Last OV Feb 2020 Has not been checking sugars as his glucometer is not working Taking metformin and lantus 10 units Denies any low hypoglycemia Last OV increased lisinopril to 28m Tolerating well Not checking BP Has seen urologist for persistent hematuria Had followup on 04/28/2018 He does not have any acute concerns today  Fall Risk  04/23/2018 02/26/2018 01/16/2018 01/06/2018 01/01/2018  Falls in the past year? 0 0 0 0 0  Number falls in past yr: 0 0 - - -  Injury with Fall? 0 0 - - -     [facility] 04/23/2018 02/26/2018 01/16/2018  Decreased Interest 0 0 0  Down, Depressed, Hopeless 0 0 0  PHQ - 2 Score 0 0 0    No Known Allergies  Prior to Admission medications   Medication Sig Start Date End Date Taking? Authorizing Provider  amLODipine (NORVASC) 5 MG tablet Take 1 tablet (5 mg total) by mouth daily. 04/05/17   CTereasa Coop PA-C  atorvastatin (LIPITOR) 40 MG tablet Take 1 tablet (40 mg total) by mouth daily. 07/08/17   CTereasa Coop PA-C  blood glucose meter kit and supplies Per insurance preference. Use up to three times daily as directed. Dx E11.9, z79.4 01/16/18   SRutherford Guys MD  Insulin Glargine (LANTUS SOLOSTAR) 100  UNIT/ML Solostar Pen Inject 15 Units into the skin at bedtime. 02/26/18   SRutherford Guys MD  Insulin Pen Needle (PEN NEEDLES) 32G X 6 MM MISC 1 each by Does not apply route daily. 01/16/18   SRutherford Guys MD  lisinopril (PRINIVIL,ZESTRIL) 20 MG tablet Take 1 tablet (20 mg total) by mouth daily. 01/16/18   SRutherford Guys MD  metFORMIN (GLUCOPHAGE XR) 500 MG 24 hr tablet Take 1 tablet (500 mg total) by mouth 2 (two) times daily with a meal. 01/16/18   SRutherford Guys MD    Past Medical History:  Diagnosis Date  . Diabetes mellitus without complication ([place]   . Hypertension     No past surgical history on file.  Social History   Tobacco Use  . Smoking status: Former Smoker    Packs/day: 2.50    Years: 10.00    Pack years: 25.00    Types: Cigarettes    Last attempt to quit: 03/28/2014    Years since quitting: 4.0  . Smokeless tobacco: Never Used  Substance Use Topics  . Alcohol use: No    Frequency: Never    Family History  Problem Relation Age of Onset  . Diabetes Mother     ROS Per hpi  Objective  Vitals as reported by the patient: none  There were no vitals filed for this visit.  ASSESSMENT and PLAN  1. Essential hypertension, benign - Lipid  panel; Future - TSH; Future - CMP14+EGFR; Future - amLODipine (NORVASC) 5 MG tablet; Take 1 tablet (5 mg total) by mouth daily.  2. Uncontrolled type 2 diabetes mellitus with hyperglycemia (HCC) - Lipid panel; Future - TSH; Future - CMP14+EGFR; Future - Hemoglobin A1c; Future - atorvastatin (LIPITOR) 40 MG tablet; Take 1 tablet (40 mg total) by mouth daily.  3. Screening for prostate cancer - PSA; Future  4. Hematuria, unspecified type  5. Dyslipidemia  6. Polycythemia - CBC; Future  Other orders - Insulin Glargine (LANTUS SOLOSTAR) 100 UNIT/ML Solostar Pen; Inject 10 Units into the skin at bedtime. - blood glucose meter kit and supplies; Per insurance preference. Use up to three times daily as directed.  Dx E11.9, z79.4   Patient overall stable. Patient will come in for fasting labs and BP check. Medications will be adjusted as needed. Keep followup with urology.   FOLLOW-UP: fasting labs and BP check within 1 week. Otherwise 3 months for routine office visit.    The above assessment and management plan was discussed with the patient. The patient verbalized understanding of and has agreed to the management plan. Patient is aware to call the clinic if symptoms persist or worsen. Patient is aware when to return to the clinic for a follow-up visit. Patient educated on when it is appropriate to go to the emergency department.    I provided 23 minutes of non-face-to-face time during this encounter.  Rutherford Guys, MD Primary Care at [facility] [place], [place] [address] Ph.  [phone] Fax [phone]

## 2018-04-23 NOTE — Progress Notes (Signed)
pt is needing refills on lipitor and amlodipine. Had a ct on kidney will get the results on 4/13.

## 2018-04-25 ENCOUNTER — Ambulatory Visit: Payer: BLUE CROSS/BLUE SHIELD

## 2018-06-24 NOTE — Telephone Encounter (Signed)
PATIENT NO SHOW.

## 2019-05-28 ENCOUNTER — Other Ambulatory Visit: Payer: Self-pay

## 2019-05-28 ENCOUNTER — Ambulatory Visit (INDEPENDENT_AMBULATORY_CARE_PROVIDER_SITE_OTHER): Payer: BC Managed Care – PPO | Admitting: Family Medicine

## 2019-05-28 ENCOUNTER — Encounter: Payer: Self-pay | Admitting: Family Medicine

## 2019-05-28 VITALS — BP 161/82 | HR 70 | Temp 98.3°F | Ht 71.0 in | Wt 156.0 lb

## 2019-05-28 DIAGNOSIS — E785 Hyperlipidemia, unspecified: Secondary | ICD-10-CM | POA: Diagnosis not present

## 2019-05-28 DIAGNOSIS — I10 Essential (primary) hypertension: Secondary | ICD-10-CM | POA: Diagnosis not present

## 2019-05-28 DIAGNOSIS — E1165 Type 2 diabetes mellitus with hyperglycemia: Secondary | ICD-10-CM | POA: Diagnosis not present

## 2019-05-28 LAB — POCT GLYCOSYLATED HEMOGLOBIN (HGB A1C): Hemoglobin A1C: 11.7 % — AB (ref 4.0–5.6)

## 2019-05-28 MED ORDER — LISINOPRIL 20 MG PO TABS: 20.0000 mg | ORAL_TABLET | Freq: Every day | ORAL | 3 refills | Status: DC

## 2019-05-28 MED ORDER — PEN NEEDLES 32G X 6 MM MISC: 1.0000 | Freq: Every day | 2 refills | Status: DC

## 2019-05-28 MED ORDER — BLOOD GLUCOSE MONITOR KIT: PACK | 0 refills | Status: AC

## 2019-05-28 MED ORDER — PIOGLITAZONE HCL 30 MG PO TABS
30.0000 mg | ORAL_TABLET | Freq: Every day | ORAL | 3 refills | Status: DC
Start: 2019-05-28 — End: 2019-06-25

## 2019-05-28 MED ORDER — METFORMIN HCL 500 MG PO TABS
500.0000 mg | ORAL_TABLET | Freq: Two times a day (BID) | ORAL | 3 refills | Status: DC
Start: 2019-05-28 — End: 2019-05-28

## 2019-05-28 MED ORDER — GLIPIZIDE 5 MG PO TABS: 5.0000 mg | ORAL_TABLET | Freq: Two times a day (BID) | ORAL | 3 refills | Status: DC

## 2019-05-28 MED ORDER — LANTUS SOLOSTAR 100 UNIT/ML ~~LOC~~ SOPN: 15.0000 [IU] | PEN_INJECTOR | Freq: Every day | SUBCUTANEOUS | 2 refills | Status: DC

## 2019-05-28 MED ORDER — ATORVASTATIN CALCIUM 40 MG PO TABS: 40.0000 mg | ORAL_TABLET | Freq: Every day | ORAL | 3 refills | Status: DC

## 2019-05-28 NOTE — Progress Notes (Signed)
5/13/20214:02 PM  Tim Stuart 1960/04/12, 59 y.o., male [id number]  Chief Complaint  Patient presents with  . Diabetes    has not been able to refill isinopril due to insurance  . Hypertension    requesting heart Dr referral due to diabetes  . discuss colonoscopy    HPI:   Patient is a 59 y.o. male with past medical history significant for DM2, HTN, HLP, former smoker who presents today for followup  Last OV a year ago Was without insurance for past year He reports that he has not taken any medication for about a year Denies any weight loss He has been having polyuria but no polydipsia Has been having blurry vision, wears eyeglasses He reports numbness and tingling of his toes He checks cbgs sometimes, last time about 20 days ago, 170, fasting late morning  Metformin ir and xr causes GI problems  Lab Results  Component Value Date   HGBA1C 9.7 (H) 01/01/2018   HGBA1C 10.2 (A) 08/02/2017   HGBA1C 10.4 (A) 07/08/2017   Lab Results  Component Value Date   LDLCALC Comment 01/01/2018   CREATININE 0.84 02/26/2018    Depression screen PHQ 2/9 05/28/2019 04/23/2018 02/26/2018  Decreased Interest 0 0 0  Down, Depressed, Hopeless 0 0 0  PHQ - 2 Score 0 0 0    Fall Risk  05/28/2019 04/23/2018 02/26/2018 01/16/2018 01/06/2018  Falls in the past year? 0 0 0 0 0  Number falls in past yr: 0 0 0 - -  Injury with Fall? 0 0 0 - -  Follow up Falls evaluation completed - - - -     No Known Allergies  Prior to Admission medications   Medication Sig Start Date End Date Taking? Authorizing Provider  amLODipine (NORVASC) 5 MG tablet Take 1 tablet (5 mg total) by mouth daily. 04/23/18  Yes Rutherford Guys, MD  blood glucose meter kit and supplies Per insurance preference. Use up to three times daily as directed. Dx E11.9, z79.4 04/23/18  Yes Rutherford Guys, MD  Insulin Glargine (LANTUS SOLOSTAR) 100 UNIT/ML Solostar Pen Inject 10 Units into the skin at bedtime. 04/23/18  Yes Rutherford Guys, MD  Insulin Pen Needle (PEN NEEDLES) 32G X 6 MM MISC 1 each by Does not apply route daily. 01/16/18  Yes Rutherford Guys, MD  atorvastatin (LIPITOR) 40 MG tablet Take 1 tablet (40 mg total) by mouth daily. Patient not taking: Reported on 05/28/2019 04/23/18   Rutherford Guys, MD  lisinopril (PRINIVIL,ZESTRIL) 20 MG tablet Take 1 tablet (20 mg total) by mouth daily. Patient not taking: Reported on 05/28/2019 01/16/18   Rutherford Guys, MD  metFORMIN (GLUCOPHAGE XR) 500 MG 24 hr tablet Take 1 tablet (500 mg total) by mouth 2 (two) times daily with a meal. Patient not taking: Reported on 05/28/2019 01/16/18   Rutherford Guys, MD    Past Medical History:  Diagnosis Date  . Diabetes mellitus without complication ([place])   . Hypertension     No past surgical history on file.  Social History   Tobacco Use  . Smoking status: Former Smoker    Packs/day: 2.50    Years: 10.00    Pack years: 25.00    Types: Cigarettes    Quit date: 03/28/2014    Years since quitting: 5.1  . Smokeless tobacco: Never Used  Substance Use Topics  . Alcohol use: No    Family History  Problem Relation Age of Onset  .  Diabetes Mother     Review of Systems  Constitutional: Negative for chills and fever.  Respiratory: Negative for cough and shortness of breath.   Cardiovascular: Negative for chest pain, palpitations and leg swelling.  Gastrointestinal: Negative for abdominal pain, nausea and vomiting.   Per hpi  OBJECTIVE:  Today's Vitals   05/28/19 1544  BP: (!) 161/82  Pulse: 70  Temp: 98.3 F (36.8 C)  SpO2: 96%  Weight: 156 lb (70.8 kg)  Height: '5\' 11"'  (1.803 m)   Body mass index is 21.76 kg/m.   Physical Exam Vitals and nursing note reviewed.  Constitutional:      Appearance: He is well-developed.  HENT:     Head: Normocephalic and atraumatic.  Eyes:     Conjunctiva/sclera: Conjunctivae normal.     Pupils: Pupils are equal, round, and reactive to light.  Cardiovascular:     Rate  and Rhythm: Normal rate and regular rhythm.     Heart sounds: No murmur. No friction rub. No gallop.   Pulmonary:     Effort: Pulmonary effort is normal.     Breath sounds: Normal breath sounds. No wheezing or rales.  Musculoskeletal:     Cervical back: Neck supple.     Right lower leg: No edema.     Left lower leg: No edema.  Skin:    General: Skin is warm and dry.  Neurological:     Mental Status: He is alert and oriented to person, place, and time.     Results for orders placed or performed in visit on 05/28/19 (from the past 24 hour(s))  POCT glycosylated hemoglobin (Hb A1C)     Status: Abnormal   Collection Time: 05/28/19  4:27 PM  Result Value Ref Range   Hemoglobin A1C 11.7 (A) 4.0 - 5.6 %   HbA1c POC (<> result, manual entry)     HbA1c, POC (prediabetic range)     HbA1c, POC (controlled diabetic range)      No results found.   ASSESSMENT and PLAN  1. Uncontrolled type 2 diabetes mellitus with hyperglycemia (HCC) Uncontrolled. Cost cont to be an issue. actos and glipizide. Restart lantus. Reviewed r/se/b. Check cbgs at home. - Ambulatory referral to Ophthalmology - Microalbumin/Creatinine Ratio, Urine - TSH - POCT glycosylated hemoglobin (Hb A1C) - atorvastatin (LIPITOR) 40 MG tablet; Take 1 tablet (40 mg total) by mouth daily.  2. Essential hypertension, benign Not controlled. Restarting lisinopril - CBC  3. Dyslipidemia Labs pending. Restart atorvastatin - CMP14+EGFR - Lipid panel  Other orders - glipiZIDE (GLUCOTROL) 5 MG tablet; Take 1 tablet (5 mg total) by mouth 2 (two) times daily before a meal. - lisinopril (ZESTRIL) 20 MG tablet; Take 1 tablet (20 mg total) by mouth daily. - insulin glargine (LANTUS SOLOSTAR) 100 UNIT/ML Solostar Pen; Inject 15 Units into the skin at bedtime. - Insulin Pen Needle (PEN NEEDLES) 32G X 6 MM MISC; 1 each by Does not apply route daily. - blood glucose meter kit and supplies KIT; Per insurance preference. Check blood  glucose three times a day. E11.65, Z79.4 - pioglitazone (ACTOS) 30 MG tablet; Take 1 tablet (30 mg total) by mouth daily.  Return in about 4 weeks (around 06/25/2019) for HTN and sugars.    Rutherford Guys, MD Primary Care at [place], [place] [address] Ph.  [phone] Fax [phone]

## 2019-05-28 NOTE — Patient Instructions (Signed)
° ° ° °  If you have lab work done today you will be contacted with your lab results within the next 2 weeks.  If you have not heard from us then please contact us. The fastest way to get your results is to register for My Chart. ° ° °IF you received an x-ray today, you will receive an invoice from [facility]. Please contact [facility] at [phone] with questions or concerns regarding your invoice.  ° °IF you received labwork today, you will receive an invoice from [facility]. Please contact [facility] at [phone] with questions or concerns regarding your invoice.  ° °Our billing staff will not be able to assist you with questions regarding bills from these companies. ° °You will be contacted with the lab results as soon as they are available. The fastest way to get your results is to activate your My Chart account. Instructions are located on the last page of this paperwork. If you have not heard from us regarding the results in 2 weeks, please contact this office. °  ° ° ° °

## 2019-05-29 ENCOUNTER — Other Ambulatory Visit: Payer: Self-pay

## 2019-05-29 ENCOUNTER — Other Ambulatory Visit: Payer: Self-pay | Admitting: Family Medicine

## 2019-05-29 LAB — CMP14+EGFR
ALT: 24 IU/L (ref 0–44)
AST: 20 IU/L (ref 0–40)
Albumin/Globulin Ratio: 1.6 (ref 1.2–2.2)
Albumin: 4.4 g/dL (ref 3.8–4.9)
Alkaline Phosphatase: 128 IU/L — ABNORMAL HIGH (ref 39–117)
BUN/Creatinine Ratio: 9 (ref 9–20)
BUN: 7 mg/dL (ref 6–24)
Bilirubin Total: 0.3 mg/dL (ref 0.0–1.2)
CO2: 23 mmol/L (ref 20–29)
Calcium: 9.3 mg/dL (ref 8.7–10.2)
Chloride: 97 mmol/L (ref 96–106)
Creatinine, Ser: 0.77 mg/dL (ref 0.76–1.27)
GFR calc Af Amer: 116 mL/min/{1.73_m2} (ref >59)
GFR calc non Af Amer: 100 mL/min/{1.73_m2} (ref >59)
Globulin, Total: 2.8 g/dL (ref 1.5–4.5)
Glucose: 327 mg/dL — ABNORMAL HIGH (ref 65–99)
Potassium: 4.2 mmol/L (ref 3.5–5.2)
Sodium: 134 mmol/L (ref 134–144)
Total Protein: 7.2 g/dL (ref 6.0–8.5)

## 2019-05-29 LAB — CBC
Hematocrit: 49.9 % (ref 37.5–51.0)
Hemoglobin: 17 g/dL (ref 13.0–17.7)
MCH: 29.3 pg (ref 26.6–33.0)
MCHC: 34.1 g/dL (ref 31.5–35.7)
MCV: 86 fL (ref 79–97)
Platelets: 236 10*3/uL (ref 150–450)
RBC: 5.8 x10E6/uL (ref 4.14–5.80)
RDW: 13.7 % (ref 11.6–15.4)
WBC: 7.8 10*3/uL (ref 3.4–10.8)

## 2019-05-29 LAB — HEMOGLOBIN A1C
Est. average glucose Bld gHb Est-mCnc: 295 mg/dL
Hgb A1c MFr Bld: 11.9 % — ABNORMAL HIGH (ref 4.8–5.6)

## 2019-05-29 LAB — MICROALBUMIN / CREATININE URINE RATIO
Creatinine, Urine: 47 mg/dL
Microalb/Creat Ratio: 217 mg/g creat — ABNORMAL HIGH (ref 0–29)
Microalbumin, Urine: 101.8 ug/mL

## 2019-05-29 LAB — LIPID PANEL
Chol/HDL Ratio: 8.8 ratio — ABNORMAL HIGH (ref 0.0–5.0)
Cholesterol, Total: 318 mg/dL — ABNORMAL HIGH (ref 100–199)
HDL: 36 mg/dL — ABNORMAL LOW (ref >39)
Triglycerides: 1119 mg/dL (ref 0–149)

## 2019-05-29 LAB — TSH: TSH: 0.744 u[IU]/mL (ref 0.450–4.500)

## 2019-05-29 MED ORDER — BASAGLAR KWIKPEN 100 UNIT/ML ~~LOC~~ SOPN: 15.0000 [IU] | PEN_INJECTOR | Freq: Every day | SUBCUTANEOUS | 3 refills | Status: DC

## 2019-05-29 NOTE — Telephone Encounter (Signed)
Please let patient know that basaglar sent to her prescription.

## 2019-05-29 NOTE — Telephone Encounter (Signed)
Pt came in and stated that he needed the basaglar insulin instead of Lantus because that is what his insurance covers. Please advise.   Medication has been pended

## 2019-06-01 NOTE — Telephone Encounter (Signed)
Noted pt. informed

## 2019-06-25 ENCOUNTER — Ambulatory Visit (INDEPENDENT_AMBULATORY_CARE_PROVIDER_SITE_OTHER): Payer: BC Managed Care – PPO | Admitting: Family Medicine

## 2019-06-25 ENCOUNTER — Encounter: Payer: Self-pay | Admitting: Family Medicine

## 2019-06-25 ENCOUNTER — Other Ambulatory Visit: Payer: Self-pay

## 2019-06-25 VITALS — BP 121/75 | HR 94 | Temp 98.1°F | Ht 71.0 in | Wt 167.0 lb

## 2019-06-25 DIAGNOSIS — I10 Essential (primary) hypertension: Secondary | ICD-10-CM | POA: Diagnosis not present

## 2019-06-25 DIAGNOSIS — E785 Hyperlipidemia, unspecified: Secondary | ICD-10-CM

## 2019-06-25 DIAGNOSIS — E1165 Type 2 diabetes mellitus with hyperglycemia: Secondary | ICD-10-CM

## 2019-06-25 MED ORDER — PIOGLITAZONE HCL 30 MG PO TABS: 30.0000 mg | ORAL_TABLET | Freq: Every day | ORAL | 3 refills | Status: DC

## 2019-06-25 MED ORDER — LISINOPRIL-HYDROCHLOROTHIAZIDE 20-12.5 MG PO TABS
1.0000 | ORAL_TABLET | Freq: Every day | ORAL | 3 refills | Status: DC
Start: 2019-06-25 — End: 2019-10-23

## 2019-06-25 NOTE — Progress Notes (Signed)
6/10/20215:23 PM  Tim Stuart 03-Mar-1960, 59 y.o., male [id number]  Chief Complaint  Patient presents with  . Diabetes    ranges from 97 to 191 / does not take actos   . Hypertension    135/80 checks at home     HPI:   Patient is a 59 y.o. male with past medical history significant for DM2, HTN, HLP, former smoker who presents today for followup  Last OV may 2021 - restart lantus 15u, atoravastatin and lisinopril  Overall doing ok Not on actos as never filled by pharmacy Checking fasting cbgs, 11 day avy 155, today at 97 Denies any low Tolerating meds wo issues Checks bp at home, reports 130-140/80s Has no acute concerns today  Lab Results  Component Value Date   HGBA1C 11.7 (A) 05/28/2019   HGBA1C 11.9 (H) 05/28/2019   HGBA1C 9.7 (H) 01/01/2018   Lab Results  Component Value Date   LDLCALC Comment (A) 05/28/2019   CREATININE 0.77 05/28/2019     Depression screen PHQ 2/9 05/28/2019 04/23/2018 02/26/2018  Decreased Interest 0 0 0  Down, Depressed, Hopeless 0 0 0  PHQ - 2 Score 0 0 0    Fall Risk  06/25/2019 05/28/2019 04/23/2018 02/26/2018 01/16/2018  Falls in the past year? 0 0 0 0 0  Number falls in past yr: 0 0 0 0 -  Injury with Fall? 0 0 0 0 -  Follow up Falls evaluation completed Falls evaluation completed - - -     No Known Allergies  Prior to Admission medications   Medication Sig Start Date End Date Taking? Authorizing Provider  amLODipine (NORVASC) 5 MG tablet Take 1 tablet (5 mg total) by mouth daily. 04/23/18  Yes Rutherford Guys, MD  atorvastatin (LIPITOR) 40 MG tablet Take 1 tablet (40 mg total) by mouth daily. 05/28/19  Yes Rutherford Guys, MD  blood glucose meter kit and supplies KIT Per insurance preference. Check blood glucose three times a day. E11.65, Z79.4 05/28/19  Yes Rutherford Guys, MD  blood glucose meter kit and supplies Per insurance preference. Use up to three times daily as directed. Dx E11.9, z79.4 04/23/18  Yes Rutherford Guys, MD   glipiZIDE (GLUCOTROL) 5 MG tablet Take 1 tablet (5 mg total) by mouth 2 (two) times daily before a meal. 05/28/19  Yes Rutherford Guys, MD  Insulin Glargine (BASAGLAR KWIKPEN) 100 UNIT/ML Inject 0.15 mLs (15 Units total) into the skin at bedtime. 05/29/19  Yes Rutherford Guys, MD  Insulin Pen Needle (PEN NEEDLES) 32G X 6 MM MISC 1 each by Does not apply route daily. 05/28/19  Yes Rutherford Guys, MD  lisinopril (ZESTRIL) 20 MG tablet Take 1 tablet (20 mg total) by mouth daily. 05/28/19  Yes Rutherford Guys, MD  pioglitazone (ACTOS) 30 MG tablet Take 1 tablet (30 mg total) by mouth daily. Patient not taking: Reported on 06/25/2019 05/28/19   Rutherford Guys, MD    Past Medical History:  Diagnosis Date  . Diabetes mellitus without complication ([place])   . Hypertension     No past surgical history on file.  Social History   Tobacco Use  . Smoking status: Former Smoker    Packs/day: 2.50    Years: 10.00    Pack years: 25.00    Types: Cigarettes    Quit date: 03/28/2014    Years since quitting: 5.2  . Smokeless tobacco: Never Used  Substance Use Topics  . Alcohol use: No  Family History  Problem Relation Age of Onset  . Diabetes Mother     Review of Systems  Constitutional: Negative for chills and fever.  Respiratory: Negative for cough and shortness of breath.   Cardiovascular: Negative for chest pain, palpitations and leg swelling.  Gastrointestinal: Negative for abdominal pain, nausea and vomiting.     OBJECTIVE:  Today's Vitals   06/25/19 1643  BP: (!) 145/78  Pulse: 94  Temp: 98.1 F (36.7 C)  SpO2: 98%  Weight: 167 lb (75.8 kg)  Height: _0  (1.803 m)   Body mass index is 23.29 kg/m.  BP Readings from Last 3 Encounters:  06/25/19 (!) 145/78  05/28/19 (!) 161/82  02/26/18 (!) 142/80    Physical Exam Vitals and nursing note reviewed.  Constitutional:      Appearance: He is well-developed.  HENT:     Head: Normocephalic and atraumatic.  Eyes:      Conjunctiva/sclera: Conjunctivae normal.     Pupils: Pupils are equal, round, and reactive to light.  Cardiovascular:     Rate and Rhythm: Normal rate and regular rhythm.     Heart sounds: No murmur heard.  No friction rub. No gallop.   Pulmonary:     Effort: Pulmonary effort is normal.     Breath sounds: Normal breath sounds. No wheezing or rales.  Musculoskeletal:     Cervical back: Neck supple.  Skin:    General: Skin is warm and dry.  Neurological:     Mental Status: He is alert and oriented to person, place, and time.     No results found for this or any previous visit (from the past 24 hour(s)).  No results found.   ASSESSMENT and PLAN  1. Uncontrolled type 2 diabetes mellitus with hyperglycemia ([place]) Restart actos. Continue lantus and glipizide.   2. Essential hypertension, benign Not controlled. Adding hctz  3. Dyslipidemia Checking labs today, medications will be adjusted as needed.  - Comprehensive metabolic panel - Lipid panel  Other orders - lisinopril-hydrochlorothiazide (ZESTORETIC) 20-12.5 MG tablet; Take 1 tablet by mouth daily. - pioglitazone (ACTOS) 30 MG tablet; Take 1 tablet (30 mg total) by mouth daily.  Return in about 3 months (around 09/25/2019).    Rutherford Guys, MD Primary Care at [place], [place] [address] Ph.  [phone] Fax [phone]

## 2019-06-26 LAB — COMPREHENSIVE METABOLIC PANEL
ALT: 18 IU/L (ref 0–44)
AST: 18 IU/L (ref 0–40)
Albumin/Globulin Ratio: 1.6 (ref 1.2–2.2)
Albumin: 4.4 g/dL (ref 3.8–4.9)
Alkaline Phosphatase: 80 IU/L (ref 48–121)
BUN/Creatinine Ratio: 17 (ref 9–20)
BUN: 12 mg/dL (ref 6–24)
Bilirubin Total: 0.3 mg/dL (ref 0.0–1.2)
CO2: 22 mmol/L (ref 20–29)
Calcium: 9.5 mg/dL (ref 8.7–10.2)
Chloride: 103 mmol/L (ref 96–106)
Creatinine, Ser: 0.71 mg/dL — ABNORMAL LOW (ref 0.76–1.27)
GFR calc Af Amer: 120 mL/min/{1.73_m2} (ref >59)
GFR calc non Af Amer: 103 mL/min/{1.73_m2} (ref >59)
Globulin, Total: 2.7 g/dL (ref 1.5–4.5)
Glucose: 208 mg/dL — ABNORMAL HIGH (ref 65–99)
Potassium: 4.4 mmol/L (ref 3.5–5.2)
Sodium: 141 mmol/L (ref 134–144)
Total Protein: 7.1 g/dL (ref 6.0–8.5)

## 2019-06-26 LAB — LIPID PANEL
Chol/HDL Ratio: 3.7 ratio (ref 0.0–5.0)
Cholesterol, Total: 220 mg/dL — ABNORMAL HIGH (ref 100–199)
HDL: 59 mg/dL (ref >39)
LDL Chol Calc (NIH): 139 mg/dL — ABNORMAL HIGH (ref 0–99)
Triglycerides: 126 mg/dL (ref 0–149)
VLDL Cholesterol Cal: 22 mg/dL (ref 5–40)

## 2019-09-25 ENCOUNTER — Ambulatory Visit: Payer: BC Managed Care – PPO | Admitting: Family Medicine

## 2019-10-23 ENCOUNTER — Other Ambulatory Visit: Payer: Self-pay

## 2019-10-23 ENCOUNTER — Encounter: Payer: Self-pay | Admitting: Family Medicine

## 2019-10-23 ENCOUNTER — Ambulatory Visit (INDEPENDENT_AMBULATORY_CARE_PROVIDER_SITE_OTHER): Payer: BC Managed Care – PPO | Admitting: Family Medicine

## 2019-10-23 VITALS — BP 150/88 | HR 65 | Temp 98.5°F | Ht 71.0 in | Wt 165.0 lb

## 2019-10-23 DIAGNOSIS — I10 Essential (primary) hypertension: Secondary | ICD-10-CM | POA: Insufficient documentation

## 2019-10-23 DIAGNOSIS — E1165 Type 2 diabetes mellitus with hyperglycemia: Secondary | ICD-10-CM | POA: Insufficient documentation

## 2019-10-23 DIAGNOSIS — R002 Palpitations: Secondary | ICD-10-CM

## 2019-10-23 DIAGNOSIS — R809 Proteinuria, unspecified: Secondary | ICD-10-CM

## 2019-10-23 DIAGNOSIS — E785 Hyperlipidemia, unspecified: Secondary | ICD-10-CM | POA: Diagnosis not present

## 2019-10-23 DIAGNOSIS — E1129 Type 2 diabetes mellitus with other diabetic kidney complication: Secondary | ICD-10-CM

## 2019-10-23 MED ORDER — PEN NEEDLES 32G X 6 MM MISC: 1.0000 | Freq: Every day | 2 refills | Status: AC

## 2019-10-23 MED ORDER — GLIPIZIDE 10 MG PO TABS: 10.0000 mg | ORAL_TABLET | Freq: Two times a day (BID) | ORAL | 1 refills | Status: DC

## 2019-10-23 MED ORDER — LISINOPRIL-HYDROCHLOROTHIAZIDE 20-12.5 MG PO TABS: 1.0000 | ORAL_TABLET | Freq: Every day | ORAL | 1 refills | Status: DC

## 2019-10-23 MED ORDER — BASAGLAR KWIKPEN 100 UNIT/ML ~~LOC~~ SOPN: 15.0000 [IU] | PEN_INJECTOR | Freq: Every day | SUBCUTANEOUS | 3 refills | Status: AC

## 2019-10-23 MED ORDER — ATORVASTATIN CALCIUM 40 MG PO TABS: 40.0000 mg | ORAL_TABLET | Freq: Every day | ORAL | 3 refills | Status: DC

## 2019-10-23 NOTE — Patient Instructions (Signed)
° ° ° °  If you have lab work done today you will be contacted with your lab results within the next 2 weeks.  If you have not heard from us then please contact us. The fastest way to get your results is to register for My Chart. ° ° °IF you received an x-ray today, you will receive an invoice from [facility]. Please contact [facility] at [phone] with questions or concerns regarding your invoice.  ° °IF you received labwork today, you will receive an invoice from [facility]. Please contact [facility] at [phone] with questions or concerns regarding your invoice.  ° °Our billing staff will not be able to assist you with questions regarding bills from these companies. ° °You will be contacted with the lab results as soon as they are available. The fastest way to get your results is to activate your My Chart account. Instructions are located on the last page of this paperwork. If you have not heard from us regarding the results in 2 weeks, please contact this office. °  ° ° ° °

## 2019-10-23 NOTE — Progress Notes (Signed)
10/8/20214:08 PM  Tim Stuart 14-May-1960, 59 y.o., male [id number]  Chief Complaint  Patient presents with   Hypertension   Diabetes    HPI:   Patient is a 59 y.o. male with past medical history significant for DM2, HTN, HLP, former smoker who presents today for routine followup  Last OV June 2021 - restarted actos, added HCTZ  Arabic interpreter via video  He is overall doing well He states cbgs are "so-so" Not checking very often, when he checks it is fasting, readings in 200s He stopped all his medications about a month ago as he was having abd pain He was taking his meds with food Not sure which caused sx but thinks it was after starting actos He has been feeling palpitations, shorts bursts, not a/w exertion, diaphoresis, nausea or SOB, chest pain Needs to have [facility]  Lab Results  Component Value Date   HGBA1C 11.7 (A) 05/28/2019   HGBA1C 11.9 (H) 05/28/2019   HGBA1C 9.7 (H) 01/01/2018   Lab Results  Component Value Date   LDLCALC 139 (H) 06/25/2019   CREATININE 0.71 (L) 06/25/2019    Depression screen PHQ 2/9 10/23/2019 05/28/2019 04/23/2018  Decreased Interest 0 0 0  Down, Depressed, Hopeless 0 0 0  PHQ - 2 Score 0 0 0    Fall Risk  10/23/2019 06/25/2019 05/28/2019 04/23/2018 02/26/2018  Falls in the past year? 0 0 0 0 0  Number falls in past yr: 0 0 0 0 0  Injury with Fall? 0 0 0 0 0  Follow up Falls evaluation completed Falls evaluation completed Falls evaluation completed - -     No Known Allergies  Prior to Admission medications   Medication Sig Start Date End Date Taking? Authorizing Provider  amLODipine (NORVASC) 5 MG tablet Take 1 tablet (5 mg total) by mouth daily. 04/23/18   Rutherford Guys, MD  atorvastatin (LIPITOR) 40 MG tablet Take 1 tablet (40 mg total) by mouth daily. 05/28/19   Rutherford Guys, MD  blood glucose meter kit and supplies KIT Per insurance preference. Check blood glucose three times a day. E11.65, Z79.4 05/28/19    Rutherford Guys, MD  blood glucose meter kit and supplies Per insurance preference. Use up to three times daily as directed. Dx E11.9, z79.4 04/23/18   Rutherford Guys, MD  glipiZIDE (GLUCOTROL) 5 MG tablet Take 1 tablet (5 mg total) by mouth 2 (two) times daily before a meal. 05/28/19   Rutherford Guys, MD  Insulin Glargine [facility] KWIKPEN) 100 UNIT/ML Inject 0.15 mLs (15 Units total) into the skin at bedtime. 05/29/19   Rutherford Guys, MD  Insulin Pen Needle (PEN NEEDLES) 32G X 6 MM MISC 1 each by Does not apply route daily. 05/28/19   Rutherford Guys, MD  lisinopril-hydrochlorothiazide (ZESTORETIC) 20-12.5 MG tablet Take 1 tablet by mouth daily. 06/25/19   Rutherford Guys, MD  pioglitazone (ACTOS) 30 MG tablet Take 1 tablet (30 mg total) by mouth daily. 06/25/19   Rutherford Guys, MD    Past Medical History:  Diagnosis Date   Diabetes mellitus without complication [facility])    Hypertension     No past surgical history on file.  Social History   Tobacco Use   Smoking status: Former Smoker    Packs/day: 2.50    Years: 10.00    Pack years: 25.00    Types: Cigarettes    Quit date: 03/28/2014    Years since quitting: 5.5  Smokeless tobacco: Never Used  Substance Use Topics   Alcohol use: No    Family History  Problem Relation Age of Onset   Diabetes Mother     Review of Systems  Constitutional: Negative for chills and fever.  Respiratory: Negative for cough and shortness of breath.   Cardiovascular: Negative for chest pain, palpitations and leg swelling.  Gastrointestinal: Negative for abdominal pain, nausea and vomiting.  Genitourinary: Negative for dysuria and hematuria.  Endo/Heme/Allergies: Negative for polydipsia.     OBJECTIVE:  Today's Vitals   10/23/19 1557  BP: (!) 165/94  Pulse: 65  Temp: 98.5 F (36.9 C)  SpO2: 98%  Weight: 165 lb (74.8 kg)  Height: '5\' 11"'  (1.803 m)   Body mass index is 23.01 kg/m.  BP Readings from Last 3 Encounters:    10/23/19 (!) 165/94  06/25/19 121/75  05/28/19 (!) 161/82   Wt Readings from Last 3 Encounters:  10/23/19 165 lb (74.8 kg)  06/25/19 167 lb (75.8 kg)  05/28/19 156 lb (70.8 kg)    Physical Exam Vitals and nursing note reviewed.  Constitutional:      Appearance: He is well-developed.  HENT:     Head: Normocephalic and atraumatic.  Eyes:     Extraocular Movements: Extraocular movements intact.     Conjunctiva/sclera: Conjunctivae normal.     Pupils: Pupils are equal, round, and reactive to light.  Cardiovascular:     Rate and Rhythm: Normal rate and regular rhythm.     Heart sounds: No murmur heard.  No friction rub. No gallop.   Pulmonary:     Effort: Pulmonary effort is normal.     Breath sounds: Normal breath sounds. No wheezing, rhonchi or rales.  Musculoskeletal:     Cervical back: Neck supple.     Right lower leg: No edema.     Left lower leg: No edema.  Skin:    General: Skin is warm and dry.  Neurological:     Mental Status: He is alert and oriented to person, place, and time.     No results found for this or any previous visit (from the past 24 hour(s)).  No results found.   Diabetic Foot Form - Detailed   Diabetic Foot Exam - detailed Diabetic Foot exam was performed with the following findings: Yes 10/23/2019  4:14 PM  Visual Foot Exam completed.: Yes  Pulse Foot Exam completed.: Yes  Sensory Foot Exam Completed.: Yes Semmes-Weinstein Monofilament Test       My interpretation of EKG:  Sinus, HR 81, no acute changes when compared to 2019  ASSESSMENT and PLAN  1. Uncontrolled type 2 diabetes mellitus with hyperglycemia ([place]) Patient off meds, discussed importance of medication compliance, discussed importance/consequences of diabetes management. Restarted glipized and basaglar as [place] most likely cause of GI sx.  - HM Diabetes Foot Exam - atorvastatin (LIPITOR) 40 MG tablet; Take 1 tablet (40 mg total) by mouth daily. - Hemoglobin A1c  2.  Essential hypertension, benign Elevated off meds. Restart lisinopril-hctz, given microalbuminuria goal to increase lisinopril 75m daily - Comprehensive metabolic panel  3. Dyslipidemia Off meds. Restart atorvastatin  4. Palpitations Sinus on ekg, labs pending. Consider cardiac referral, RTC precautions given - EKG 12-Lead - CBC with Differential - TSH  5. Microalbuminuria due to type 2 diabetes mellitus ([place] restarting ACE, discussed importance of diabetes control  Other orders - Insulin Glargine (BASAGLAR KWIKPEN) 100 UNIT/ML; Inject 15 Units into the skin at bedtime. - glipiZIDE (GLUCOTROL) 10 MG tablet;  Take 1 tablet (10 mg total) by mouth 2 (two) times daily before a meal. - Insulin Pen Needle (PEN NEEDLES) 32G X 6 MM MISC; 1 each by Does not apply route daily. - lisinopril-hydrochlorothiazide (ZESTORETIC) 20-12.5 MG tablet; Take 1 tablet by mouth daily.  Return in about 4 weeks (around 11/20/2019) for [facility] - ensure improved compliance.    Rutherford Guys, MD Primary Care at [place],  [address] Ph.  [phone] Fax [phone]

## 2019-10-24 LAB — CBC WITH DIFFERENTIAL/PLATELET
Absolute Monocytes: 577 cells/uL (ref 200–950)
Basophils Absolute: 29 cells/uL (ref 0–200)
Basophils Relative: 0.4 %
Eosinophils Absolute: 124 cells/uL (ref 15–500)
Eosinophils Relative: 1.7 %
HCT: 50.8 % — ABNORMAL HIGH (ref 38.5–50.0)
Hemoglobin: 17 g/dL (ref 13.2–17.1)
Lymphs Abs: 2102 cells/uL (ref 850–3900)
MCH: 28.6 pg (ref 27.0–33.0)
MCHC: 33.5 g/dL (ref 32.0–36.0)
MCV: 85.4 fL (ref 80.0–100.0)
MPV: 10.7 fL (ref 7.5–12.5)
Monocytes Relative: 7.9 %
Neutro Abs: 4468 cells/uL (ref 1500–7800)
Neutrophils Relative %: 61.2 %
Platelets: 253 10*3/uL (ref 140–400)
RBC: 5.95 10*6/uL — ABNORMAL HIGH (ref 4.20–5.80)
RDW: 12.8 % (ref 11.0–15.0)
Total Lymphocyte: 28.8 %
WBC: 7.3 10*3/uL (ref 3.8–10.8)

## 2019-10-24 LAB — COMPREHENSIVE METABOLIC PANEL
AG Ratio: 1.5 (calc) (ref 1.0–2.5)
ALT: 27 U/L (ref 9–46)
AST: 17 U/L (ref 10–35)
Albumin: 4.3 g/dL (ref 3.6–5.1)
Alkaline phosphatase (APISO): 95 U/L (ref 35–144)
BUN: 9 mg/dL (ref 7–25)
CO2: 25 mmol/L (ref 20–32)
Calcium: 9.7 mg/dL (ref 8.6–10.3)
Chloride: 97 mmol/L — ABNORMAL LOW (ref 98–110)
Creat: 0.83 mg/dL (ref 0.70–1.33)
Globulin: 2.9 g/dL (calc) (ref 1.9–3.7)
Glucose, Bld: 317 mg/dL — ABNORMAL HIGH (ref 65–99)
Potassium: 3.9 mmol/L (ref 3.5–5.3)
Sodium: 133 mmol/L — ABNORMAL LOW (ref 135–146)
Total Bilirubin: 0.5 mg/dL (ref 0.2–1.2)
Total Protein: 7.2 g/dL (ref 6.1–8.1)

## 2019-10-24 LAB — HEMOGLOBIN A1C
Hgb A1c MFr Bld: 13.9 % of total Hgb — ABNORMAL HIGH (ref <5.7)
Mean Plasma Glucose: 352 (calc)
eAG (mmol/L): 19.5 (calc)

## 2019-10-24 LAB — TSH: TSH: 0.52 mIU/L (ref 0.40–4.50)

## 2019-10-25 ENCOUNTER — Encounter: Payer: Self-pay | Admitting: Family Medicine

## 2019-12-18 ENCOUNTER — Encounter: Payer: BC Managed Care – PPO | Admitting: Registered Nurse

## 2020-09-26 IMAGING — DX DG FEMUR 2+V*R*
4 series · 4 of 4 positions shown · non-contrast
Comparison: None

CLINICAL DATA: Acute low back and RIGHT thigh pain

EXAM:
RIGHT FEMUR 2 VIEWS

[femur ap (1 of 2)]
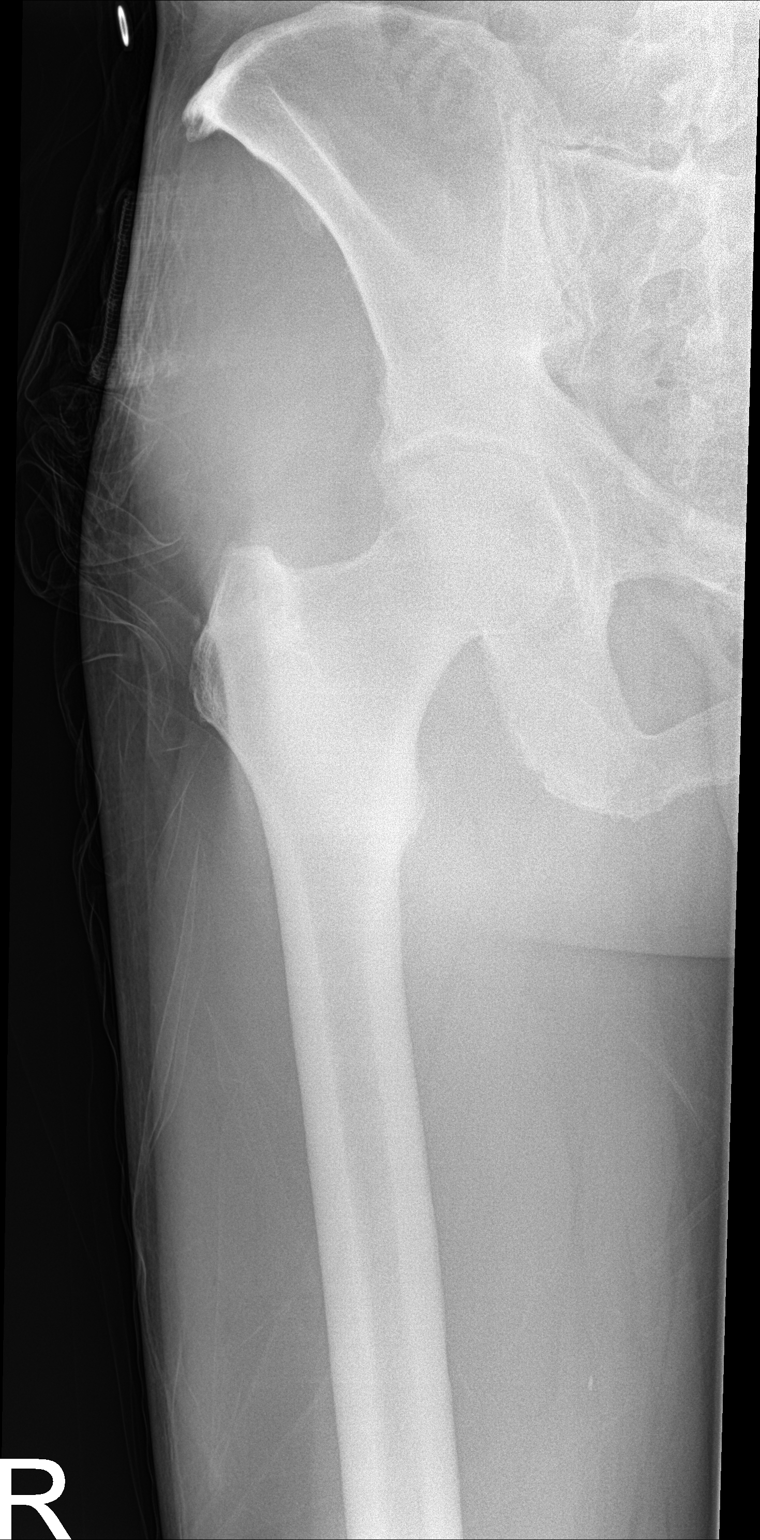

[femur ap (2 of 2)]
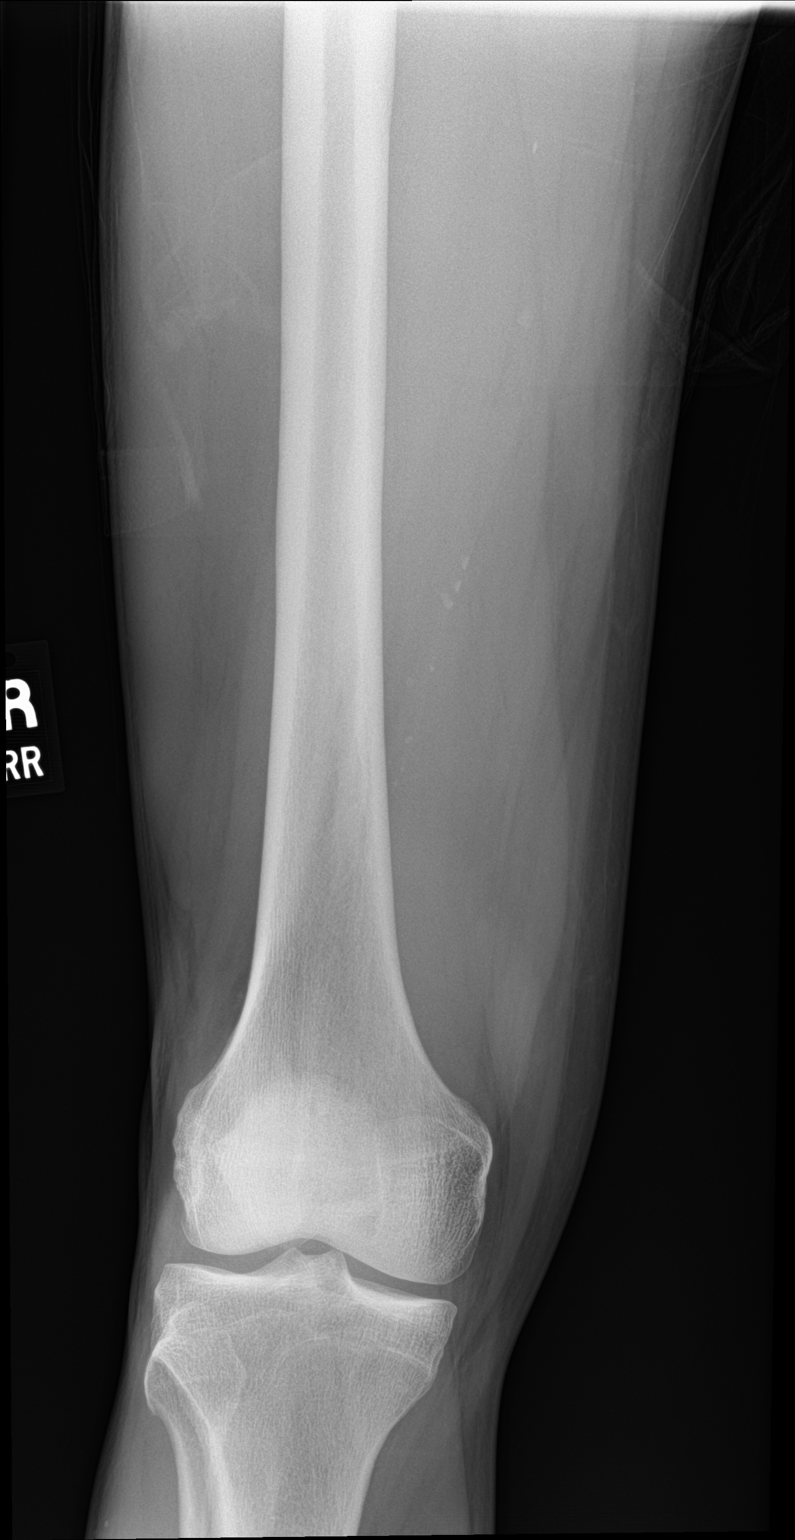

[femur lat (1 of 2)]
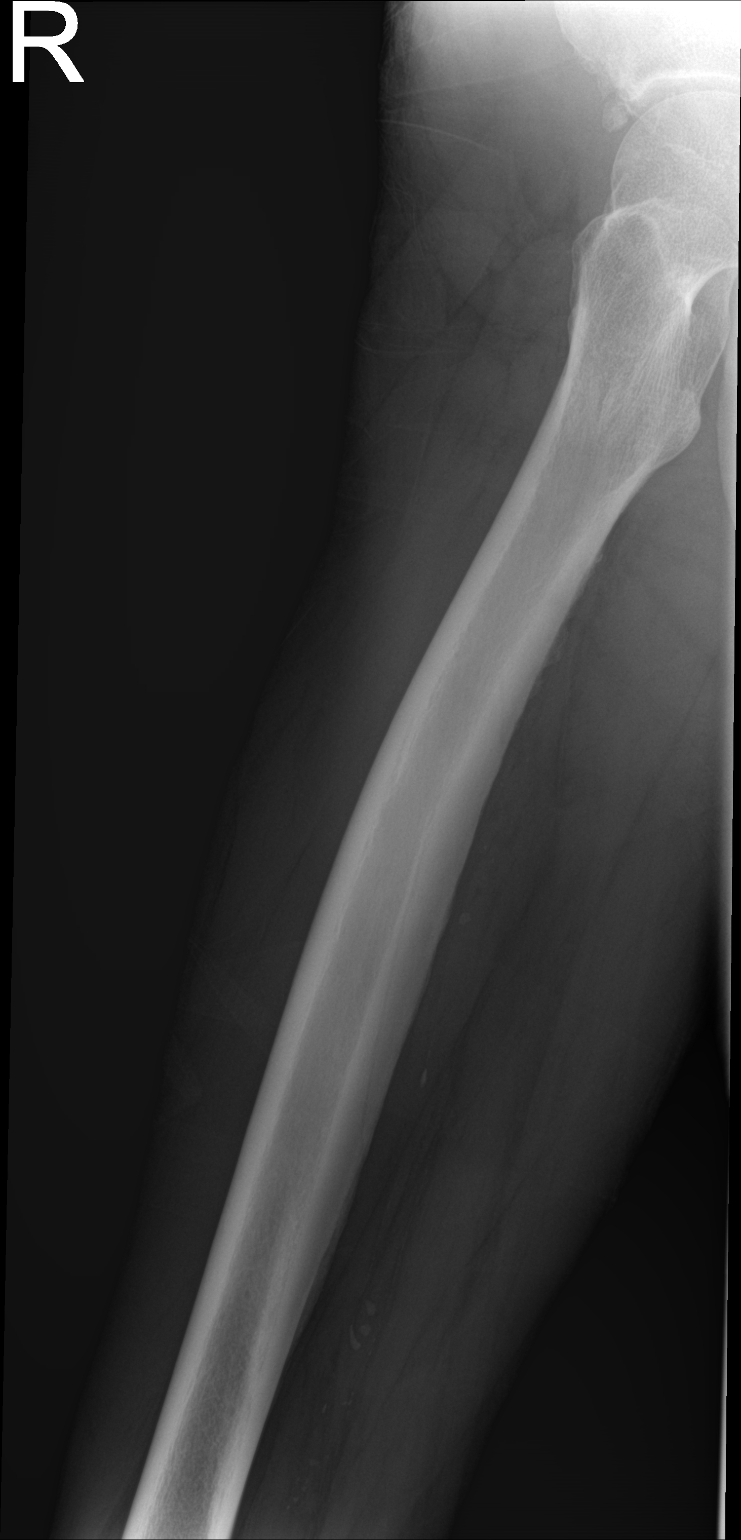

[femur lat (2 of 2)]
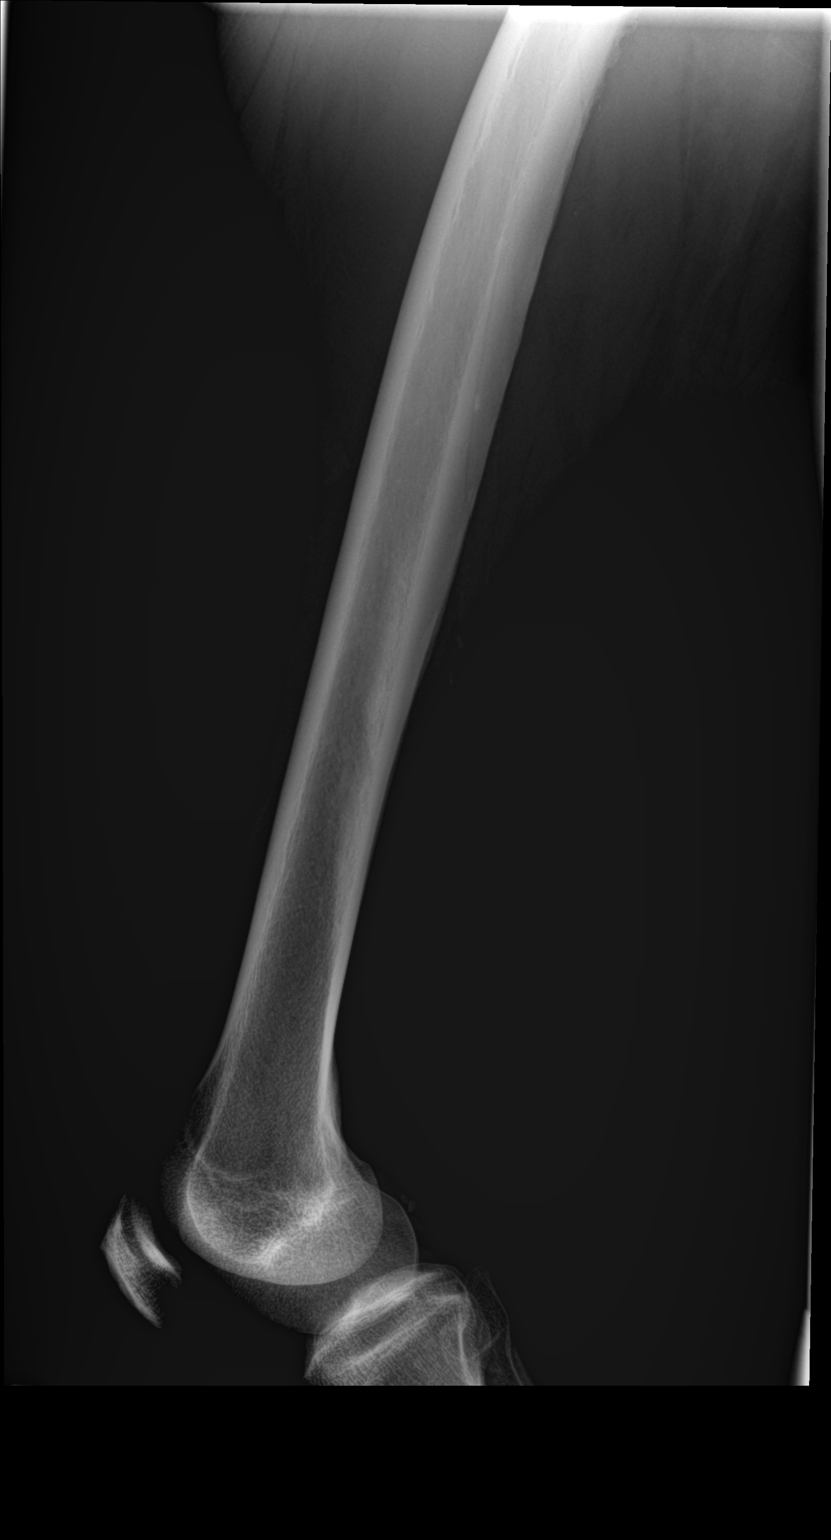

[4 of 4 positions shown; findings below may reference images not displayed]

FINDINGS: Slight under penetration at the hip.

Osseous mineralization normal.

Joint spaces preserved.

No acute fracture, dislocation, or bone destruction.

Minimal atherosclerotic calcifications at the RIGHT superficial
femoral artery.
IMPRESSION: No acute osseous abnormalities.

## 2021-07-19 LAB — EXTERNAL GENERIC LAB PROCEDURE: COLOGUARD: NEGATIVE

## 2022-02-09 ENCOUNTER — Encounter: Payer: Self-pay | Admitting: Nurse Practitioner

## 2022-02-09 ENCOUNTER — Ambulatory Visit: Payer: BC Managed Care – PPO | Admitting: Nurse Practitioner

## 2022-02-09 VITALS — BP 144/72 | HR 94 | Temp 97.9°F | Ht 70.0 in | Wt 152.6 lb

## 2022-02-09 DIAGNOSIS — E1165 Type 2 diabetes mellitus with hyperglycemia: Secondary | ICD-10-CM

## 2022-02-09 DIAGNOSIS — E785 Hyperlipidemia, unspecified: Secondary | ICD-10-CM

## 2022-02-09 DIAGNOSIS — I10 Essential (primary) hypertension: Secondary | ICD-10-CM

## 2022-02-09 LAB — POCT GLYCOSYLATED HEMOGLOBIN (HGB A1C)
HbA1c POC (<> result, manual entry): 12.5 % (ref 4.0–5.6)
HbA1c, POC (controlled diabetic range): 12.5 % — AB (ref 0.0–7.0)
HbA1c, POC (prediabetic range): 12.5 % — AB (ref 5.7–6.4)
Hemoglobin A1C: 12.5 % — AB (ref 4.0–5.6)

## 2022-02-09 MED ORDER — GLIPIZIDE 10 MG PO TABS: 10.0000 mg | ORAL_TABLET | Freq: Two times a day (BID) | ORAL | 1 refills | Status: AC

## 2022-02-09 MED ORDER — ATORVASTATIN CALCIUM 40 MG PO TABS: 40.0000 mg | ORAL_TABLET | Freq: Every day | ORAL | 3 refills | Status: AC

## 2022-02-09 MED ORDER — METFORMIN HCL ER 500 MG PO TB24: 500.0000 mg | ORAL_TABLET | Freq: Two times a day (BID) | ORAL | 2 refills | Status: DC

## 2022-02-09 MED ORDER — LISINOPRIL-HYDROCHLOROTHIAZIDE 20-12.5 MG PO TABS: 1.0000 | ORAL_TABLET | Freq: Every day | ORAL | 1 refills | Status: AC

## 2022-02-09 NOTE — Patient Instructions (Signed)
1. Uncontrolled type 2 diabetes mellitus with hyperglycemia (H[facility])  - Microalbumin/Creatinine Ratio, Urine - POCT glycosylated hemoglobin (Hb A1C) - atorvastatin (LIPITOR) 40 MG tablet; Take 1 tablet (40 mg total) by mouth daily.  Dispense: 90 tablet; Refill: 3 - lisinopril-hydrochlorothiazide (ZESTORETIC) 20-12.5 MG tablet; Take 1 tablet by mouth daily.  Dispense: 90 tablet; Refill: 1 - CBC - Comprehensive metabolic panel - glipiZIDE (GLUCOTROL) 10 MG tablet; Take 1 tablet (10 mg total) by mouth 2 (two) times daily before a meal.  Dispense: 180 tablet; Refill: 1  Lab Results  Component Value Date   HGBA1C 12.5 (A) 02/09/2022   HGBA1C 12.5 02/09/2022   HGBA1C 12.5 (A) 02/09/2022   HGBA1C 12.5 (A) 02/09/2022     2. Dyslipidemia  - atorvastatin (LIPITOR) 40 MG tablet; Take 1 tablet (40 mg total) by mouth daily.  Dispense: 90 tablet; Refill: 3 - Lipid Panel  3. Essential hypertension, benign  - lisinopril-hydrochlorothiazide (ZESTORETIC) 20-12.5 MG tablet; Take 1 tablet by mouth daily.  Dispense: 90 tablet; Refill: 1  Follow up:  Follow up in 3 months

## 2022-02-09 NOTE — Progress Notes (Unsigned)
  Subjective:    Patient ID: Tim Stuart, male    DOB: 08/29/1960, 62 y.o.   MRN: [id number]  Tim Stuart is a 62 y.o. male who presents for follow-up of Type 2 diabetes mellitus.  Patient is a 62 y.o. male with past medical history significant for DM2, HTN, HLP, former smoker who presents today for routine followup   Patient {is/are not:32546} checking home blood sugars.   Home blood sugar records: {dm home sugars:14018} How often is blood sugars being checked: *** Current symptoms/problems include {Symptoms; diabetes:14075} and have been {Desc; course:15616}. Daily foot checks: ***   Any foot concerns: *** Last eye exam: *** Exercise: {types:19826}   A1C today 12.5  The following portions of the patient's history were reviewed and updated as appropriate: allergies, current medications, past medical history, past social history and problem list.  .ros     Objective:    Physical Exam Alert and in no distress otherwise not examined.  There were no vitals taken for this visit.  Lab Review    Latest Ref Rng & Units 10/23/2019    5:05 PM 10/23/2019    4:59 PM 06/25/2019    5:38 PM 05/28/2019    4:27 PM 05/28/2019    4:24 PM  Diabetic Labs  HbA1c <5.7 % of total Hgb 13.9    11.7  11.9   Chol 100 - 199 mg/dL   220   318   HDL >39 mg/dL   59   36   Calc LDL 0 - 99 mg/dL   139   Comment   Triglycerides 0 - 149 mg/dL   126   1,119   Creatinine 0.70 - 1.33 mg/dL  0.83  0.71   0.77       10/23/2019    5:06 PM 10/23/2019    3:57 PM 06/25/2019    5:28 PM 06/25/2019    4:43 PM 05/28/2019    3:44 PM  BP/Weight  Systolic BP 836 [phone] 503  Diastolic BP 88 94 75 78 82  Wt. (Lbs)  165  167 156  BMI  23.01 kg/m2  23.29 kg/m2 21.76 kg/m2      10/23/2019    3:40 PM 07/08/2017    8:20 AM  Foot/eye exam completion dates  Foot Form Completion Done Done    Tim Stuart  reports that he quit smoking about 7 years ago. His smoking use included cigarettes. He has a 25.00 pack-year  smoking history. He has never used smokeless tobacco. He reports that he does not drink alcohol and does not use drugs.     Assessment & Plan:    No diagnosis found.  Rx changes: {none:33079} Education: Reviewed 'ABCs' of diabetes management (respective goals in parentheses):  A1C (<7), blood pressure (<130/80), and cholesterol (LDL <100). Compliance at present is estimated to be {good/fair/poor:33178}. Efforts to improve compliance (if necessary) will be directed at {compliance:16716}. Follow up: {NUMBERS; 0-10:33138} {time:11}

## 2022-02-10 LAB — COMPREHENSIVE METABOLIC PANEL
ALT: 17 IU/L (ref 0–44)
AST: 15 IU/L (ref 0–40)
Albumin/Globulin Ratio: 1.6 (ref 1.2–2.2)
Albumin: 4.4 g/dL (ref 3.9–4.9)
Alkaline Phosphatase: 118 IU/L (ref 44–121)
BUN/Creatinine Ratio: 15 (ref 10–24)
BUN: 12 mg/dL (ref 8–27)
Bilirubin Total: 0.4 mg/dL (ref 0.0–1.2)
CO2: 23 mmol/L (ref 20–29)
Calcium: 9.6 mg/dL (ref 8.6–10.2)
Chloride: 96 mmol/L (ref 96–106)
Creatinine, Ser: 0.78 mg/dL (ref 0.76–1.27)
Globulin, Total: 2.7 g/dL (ref 1.5–4.5)
Glucose: 425 mg/dL — ABNORMAL HIGH (ref 70–99)
Potassium: 4.3 mmol/L (ref 3.5–5.2)
Sodium: 135 mmol/L (ref 134–144)
Total Protein: 7.1 g/dL (ref 6.0–8.5)
eGFR: 101 mL/min/{1.73_m2} (ref >59)

## 2022-02-10 LAB — LIPID PANEL
Chol/HDL Ratio: 4.9 ratio (ref 0.0–5.0)
Cholesterol, Total: 237 mg/dL — ABNORMAL HIGH (ref 100–199)
HDL: 48 mg/dL (ref >39)
LDL Chol Calc (NIH): 135 mg/dL — ABNORMAL HIGH (ref 0–99)
Triglycerides: 301 mg/dL — ABNORMAL HIGH (ref 0–149)
VLDL Cholesterol Cal: 54 mg/dL — ABNORMAL HIGH (ref 5–40)

## 2022-02-10 LAB — CBC
Hematocrit: 48.9 % (ref 37.5–51.0)
Hemoglobin: 16.5 g/dL (ref 13.0–17.7)
MCH: 28.9 pg (ref 26.6–33.0)
MCHC: 33.7 g/dL (ref 31.5–35.7)
MCV: 86 fL (ref 79–97)
Platelets: 206 10*3/uL (ref 150–450)
RBC: 5.71 x10E6/uL (ref 4.14–5.80)
RDW: 12.6 % (ref 11.6–15.4)
WBC: 8.1 10*3/uL (ref 3.4–10.8)

## 2022-02-12 ENCOUNTER — Encounter: Payer: Self-pay | Admitting: Nurse Practitioner

## 2022-02-12 LAB — MICROALBUMIN / CREATININE URINE RATIO
Creatinine, Urine: 25.8 mg/dL
Microalb/Creat Ratio: 163 mg/g creat — ABNORMAL HIGH (ref 0–29)
Microalbumin, Urine: 42.1 ug/mL

## 2022-02-16 ENCOUNTER — Other Ambulatory Visit: Payer: Self-pay | Admitting: Nurse Practitioner

## 2022-02-16 DIAGNOSIS — N289 Disorder of kidney and ureter, unspecified: Secondary | ICD-10-CM

## 2022-05-11 ENCOUNTER — Ambulatory Visit: Payer: BC Managed Care – PPO | Admitting: Nurse Practitioner
